# Patient Record
Sex: Female | Born: 1990 | Race: Black or African American | Hispanic: No | Smoking: Current some day smoker
Health system: Southern US, Community
[De-identification: ages and names within clinical notes are randomized; demographics above are authoritative.]

## PROBLEM LIST (undated history)

## (undated) DIAGNOSIS — F419 Anxiety disorder, unspecified: Secondary | ICD-10-CM

## (undated) DIAGNOSIS — T7840XA Allergy, unspecified, initial encounter: Secondary | ICD-10-CM

## (undated) DIAGNOSIS — J020 Streptococcal pharyngitis: Secondary | ICD-10-CM

## (undated) DIAGNOSIS — F329 Major depressive disorder, single episode, unspecified: Secondary | ICD-10-CM

## (undated) DIAGNOSIS — M797 Fibromyalgia: Secondary | ICD-10-CM

## (undated) DIAGNOSIS — F4310 Post-traumatic stress disorder, unspecified: Secondary | ICD-10-CM

## (undated) DIAGNOSIS — F32A Depression, unspecified: Secondary | ICD-10-CM

## (undated) HISTORY — DX: Allergy, unspecified, initial encounter: T78.40XA

## (undated) HISTORY — DX: Post-traumatic stress disorder, unspecified: F43.10

## (undated) HISTORY — DX: Depression, unspecified: F32.A

## (undated) HISTORY — DX: Major depressive disorder, single episode, unspecified: F32.9

## (undated) HISTORY — PX: WISDOM TOOTH EXTRACTION: SHX21

---

## 2014-03-08 ENCOUNTER — Ambulatory Visit (INDEPENDENT_AMBULATORY_CARE_PROVIDER_SITE_OTHER): Payer: No Typology Code available for payment source | Admitting: Emergency Medicine

## 2014-03-08 VITALS — BP 110/64 | HR 85 | Temp 98.5°F | Resp 16 | Ht 61.0 in | Wt 155.0 lb

## 2014-03-08 DIAGNOSIS — Z01818 Encounter for other preprocedural examination: Secondary | ICD-10-CM

## 2014-03-08 LAB — CBC WITH DIFFERENTIAL/PLATELET
BASOS ABS: 0 10*3/uL (ref 0.0–0.1)
Basophils Relative: 0 % (ref 0–1)
EOS PCT: 1 % (ref 0–5)
Eosinophils Absolute: 0.1 10*3/uL (ref 0.0–0.7)
HCT: 40.2 % (ref 36.0–46.0)
Hemoglobin: 13.3 g/dL (ref 12.0–15.0)
Lymphocytes Relative: 30 % (ref 12–46)
Lymphs Abs: 2.3 10*3/uL (ref 0.7–4.0)
MCH: 28.2 pg (ref 26.0–34.0)
MCHC: 33.1 g/dL (ref 30.0–36.0)
MCV: 85.4 fL (ref 78.0–100.0)
Monocytes Absolute: 0.6 10*3/uL (ref 0.1–1.0)
Monocytes Relative: 8 % (ref 3–12)
Neutro Abs: 4.8 10*3/uL (ref 1.7–7.7)
Neutrophils Relative %: 61 % (ref 43–77)
PLATELETS: 204 10*3/uL (ref 150–400)
RBC: 4.71 MIL/uL (ref 3.87–5.11)
RDW: 14.7 % (ref 11.5–15.5)
WBC: 7.8 10*3/uL (ref 4.0–10.5)

## 2014-03-08 NOTE — Progress Notes (Signed)
[facility] [address], [place] [address] [phone]  Date:  03/08/2014   Name:  Robin Richmond   DOB:  Jul 23, 1991   MRN:  [id number]  PCP:  No PCP Per Patient    Chief Complaint: Annual Exam   History of Present Illness:  Robin Richmond is a 23 y.o. very pleasant female patient who presents with the following:  Preoperative clearance for plastic surgery.  No medications,  No current medical problems.  Stopped smoking 10 days ago.  Denies other complaint or health concern today.   There are no active problems to display for this patient.   Past Medical History  Diagnosis Date  . Allergy     History reviewed. No pertinent past surgical history.  History  Substance Use Topics  . Smoking status: Former Games developermoker  . Smokeless tobacco: Not on file  . Alcohol Use: Not on file    Family History  Problem Relation Age of Onset  . Diabetes Father   . Stroke Father     Allergies  Allergen Reactions  . Penicillins Rash    Medication list has been reviewed and updated.  No current outpatient prescriptions on file prior to visit.   No current facility-administered medications on file prior to visit.    Review of Systems:  As per HPI, otherwise negative.    Physical Examination: Filed Vitals:   03/08/14 1702  BP: 110/64  Pulse: 85  Temp: 98.5 F (36.9 C)  Resp: 16   Filed Vitals:   03/08/14 1702  Height: 5\' 1"  (1.549 m)  Weight: 155 lb (70.308 kg)   Body mass index is 29.3 kg/(m^2). Ideal Body Weight: Weight in (lb) to have BMI = 25: 132  GEN: WDWN, NAD, Non-toxic, A & O x 3 HEENT: Atraumatic, Normocephalic. Neck supple. No masses, No LAD. Ears and Nose: No external deformity. CV: RRR, No M/G/R. No JVD. No thrill. No extra heart sounds. PULM: CTA B, no wheezes, crackles, rhonchi. No retractions. No resp. distress. No accessory muscle use. ABD: S, NT, ND, +BS. No rebound. No HSM. EXTR: No c/c/e NEURO Normal gait.  PSYCH:  Normally interactive. Conversant. Not depressed or anxious appearing.  Calm demeanor.    Assessment and Plan: Preoperative assessment CBC pending   Signed,  Phillips OdorJeffery Siria Calandro, MD

## 2014-03-08 NOTE — Addendum Note (Signed)
Addended by: Thelma BargeICHARDSON, Finlee Concepcion D on: 03/08/2014 05:27 PM   Modules accepted: Orders

## 2014-03-11 ENCOUNTER — Telehealth: Payer: Self-pay | Admitting: Radiology

## 2014-03-11 ENCOUNTER — Telehealth: Payer: Self-pay

## 2014-03-11 NOTE — Telephone Encounter (Signed)
Please review CBC...the patient needing for Pre-Op.

## 2014-03-11 NOTE — Telephone Encounter (Signed)
The patient dropped off paperwork to be filled out with results of CBC; need to be faxed to number on form....leaving form in 03/11/14 date in notebook at [employer] Plumber[employer]bara Briggs' desk.  Please call the patient once form is faxed.  The patient's phone # is [phone].

## 2014-03-12 NOTE — Telephone Encounter (Signed)
Form completed and faxed to Dr Greig CastillaAndrew Jimerson's office w/confirmation. Notified pt and left orig for her to p/up. Scanned copy.

## 2014-03-12 NOTE — Telephone Encounter (Signed)
PT CALLED AGAIN THIS MORNING STRESSING THE IMPORTANCE OF THE FORM THAT NEEDED SIGNING BY THE DR AND FAXED. THE FAX NUMBER SHE SAID IS ON THE FORM SO SHE DIDN'T REMEMBER WHAT IT IS STATED SHE NEED THIS BY 1:30 TODAY. PLEASE CALL [phone] WHEN DONE

## 2014-03-12 NOTE — Telephone Encounter (Signed)
Copy of CBC and OV notes printed and put in Dr Ewell PoeAnderson's box w/form for completion.

## 2014-03-12 NOTE — Telephone Encounter (Signed)
Dr. Dareen PianoAnderson please review the CBC and let the lab know. Do you have this paperwork to fill out? If you need me to I can fill out and fax for pt. Let me know.

## 2014-05-15 ENCOUNTER — Encounter (HOSPITAL_COMMUNITY): Payer: Self-pay | Admitting: Emergency Medicine

## 2014-05-15 DIAGNOSIS — R1084 Generalized abdominal pain: Secondary | ICD-10-CM | POA: Insufficient documentation

## 2014-05-15 DIAGNOSIS — R112 Nausea with vomiting, unspecified: Secondary | ICD-10-CM | POA: Insufficient documentation

## 2014-05-15 DIAGNOSIS — R197 Diarrhea, unspecified: Secondary | ICD-10-CM | POA: Diagnosis not present

## 2014-05-15 DIAGNOSIS — J020 Streptococcal pharyngitis: Secondary | ICD-10-CM | POA: Insufficient documentation

## 2014-05-15 DIAGNOSIS — Z880 Allergy status to penicillin: Secondary | ICD-10-CM | POA: Diagnosis not present

## 2014-05-15 DIAGNOSIS — Z3202 Encounter for pregnancy test, result negative: Secondary | ICD-10-CM | POA: Diagnosis not present

## 2014-05-15 DIAGNOSIS — Z87891 Personal history of nicotine dependence: Secondary | ICD-10-CM | POA: Diagnosis not present

## 2014-05-15 NOTE — ED Notes (Signed)
Pt. reports nausea , vomitting and diarrhea onset this morning , currently taking Erythromycin for throat infection ( strep) diagnosed at school [facility] . No fever or chills.

## 2014-05-16 ENCOUNTER — Emergency Department (HOSPITAL_COMMUNITY)
Admission: EM | Admit: 2014-05-16 | Discharge: 2014-05-16 | Disposition: A | Discharge status: Home / Self Care | Payer: No Typology Code available for payment source | Attending: Emergency Medicine | Admitting: Emergency Medicine

## 2014-05-16 DIAGNOSIS — J020 Streptococcal pharyngitis: Secondary | ICD-10-CM

## 2014-05-16 DIAGNOSIS — R112 Nausea with vomiting, unspecified: Secondary | ICD-10-CM

## 2014-05-16 DIAGNOSIS — R197 Diarrhea, unspecified: Secondary | ICD-10-CM

## 2014-05-16 DIAGNOSIS — R1084 Generalized abdominal pain: Secondary | ICD-10-CM

## 2014-05-16 HISTORY — DX: Streptococcal pharyngitis: J02.0

## 2014-05-16 LAB — COMPREHENSIVE METABOLIC PANEL
ALT: 30 U/L (ref 0–35)
AST: 23 U/L (ref 0–37)
Albumin: 3.9 g/dL (ref 3.5–5.2)
Alkaline Phosphatase: 84 U/L (ref 39–117)
Anion gap: 17 — ABNORMAL HIGH (ref 5–15)
BUN: 12 mg/dL (ref 6–23)
CALCIUM: 9.8 mg/dL (ref 8.4–10.5)
CO2: 22 mEq/L (ref 19–32)
Chloride: 99 mEq/L (ref 96–112)
Creatinine, Ser: 0.54 mg/dL (ref 0.50–1.10)
GLUCOSE: 118 mg/dL — AB (ref 70–99)
Potassium: 3.8 mEq/L (ref 3.7–5.3)
SODIUM: 138 meq/L (ref 137–147)
Total Bilirubin: 0.3 mg/dL (ref 0.3–1.2)
Total Protein: 9.2 g/dL — ABNORMAL HIGH (ref 6.0–8.3)

## 2014-05-16 LAB — CBC WITH DIFFERENTIAL/PLATELET
Basophils Absolute: 0 10*3/uL (ref 0.0–0.1)
Basophils Relative: 0 % (ref 0–1)
EOS ABS: 0 10*3/uL (ref 0.0–0.7)
EOS PCT: 0 % (ref 0–5)
HEMATOCRIT: 42.5 % (ref 36.0–46.0)
Hemoglobin: 13.7 g/dL (ref 12.0–15.0)
LYMPHS ABS: 0.9 10*3/uL (ref 0.7–4.0)
Lymphocytes Relative: 5 % — ABNORMAL LOW (ref 12–46)
MCH: 27.5 pg (ref 26.0–34.0)
MCHC: 32.2 g/dL (ref 30.0–36.0)
MCV: 85.2 fL (ref 78.0–100.0)
MONO ABS: 0.7 10*3/uL (ref 0.1–1.0)
Monocytes Relative: 4 % (ref 3–12)
Neutro Abs: 15 10*3/uL — ABNORMAL HIGH (ref 1.7–7.7)
Neutrophils Relative %: 91 % — ABNORMAL HIGH (ref 43–77)
PLATELETS: 237 10*3/uL (ref 150–400)
RBC: 4.99 MIL/uL (ref 3.87–5.11)
RDW: 13.9 % (ref 11.5–15.5)
WBC: 16.7 10*3/uL — ABNORMAL HIGH (ref 4.0–10.5)

## 2014-05-16 LAB — PREGNANCY, URINE: Preg Test, Ur: NEGATIVE

## 2014-05-16 LAB — URINALYSIS, ROUTINE W REFLEX MICROSCOPIC
Bilirubin Urine: NEGATIVE
Glucose, UA: NEGATIVE mg/dL
Ketones, ur: 40 mg/dL — AB
Nitrite: NEGATIVE
PROTEIN: 100 mg/dL — AB
Specific Gravity, Urine: 1.035 — ABNORMAL HIGH (ref 1.005–1.030)
UROBILINOGEN UA: 0.2 mg/dL (ref 0.0–1.0)
pH: 6 (ref 5.0–8.0)

## 2014-05-16 LAB — URINE MICROSCOPIC-ADD ON

## 2014-05-16 MED ORDER — CEFTRIAXONE SODIUM 1 G IJ SOLR
1.0000 g | Freq: Once | INTRAMUSCULAR | Status: AC
  Administered 2014-05-16: 1 g via INTRAVENOUS
  Filled 2014-05-16: qty 10

## 2014-05-16 MED ORDER — ONDANSETRON HCL 4 MG/2ML IJ SOLN
4.0000 mg | Freq: Once | INTRAMUSCULAR | Status: AC
  Administered 2014-05-16: 4 mg via INTRAVENOUS
  Filled 2014-05-16: qty 2

## 2014-05-16 MED ORDER — ONDANSETRON 4 MG PO TBDP: ORAL_TABLET | ORAL | Status: DC

## 2014-05-16 MED ORDER — METOCLOPRAMIDE HCL 5 MG/ML IJ SOLN
10.0000 mg | Freq: Once | INTRAMUSCULAR | Status: AC
  Administered 2014-05-16: 10 mg via INTRAVENOUS
  Filled 2014-05-16: qty 2

## 2014-05-16 MED ORDER — SODIUM CHLORIDE 0.9 % IV BOLUS (SEPSIS)
1000.0000 mL | Freq: Once | INTRAVENOUS | Status: AC
  Administered 2014-05-16: 1000 mL via INTRAVENOUS

## 2014-05-16 MED ORDER — ONDANSETRON HCL 4 MG/2ML IJ SOLN
4.0000 mg | Freq: Once | INTRAMUSCULAR | Status: AC
Start: 2014-05-16 — End: 2014-05-16
  Administered 2014-05-16: 4 mg via INTRAVENOUS

## 2014-05-16 MED ORDER — IBUPROFEN 600 MG PO TABS: 600.0000 mg | ORAL_TABLET | Freq: Four times a day (QID) | ORAL | Status: DC | PRN

## 2014-05-16 MED ORDER — KETOROLAC TROMETHAMINE 15 MG/ML IJ SOLN
15.0000 mg | Freq: Once | INTRAMUSCULAR | Status: AC
  Administered 2014-05-16: 15 mg via INTRAVENOUS
  Filled 2014-05-16: qty 1

## 2014-05-16 MED ORDER — FENTANYL CITRATE 0.05 MG/ML IJ SOLN
75.0000 ug | Freq: Once | INTRAMUSCULAR | Status: AC
  Administered 2014-05-16: 75 ug via INTRAVENOUS
  Filled 2014-05-16: qty 2

## 2014-05-16 MED ORDER — CEPHALEXIN 500 MG PO CAPS: 500.0000 mg | ORAL_CAPSULE | Freq: Three times a day (TID) | ORAL | Status: DC

## 2014-05-16 NOTE — ED Provider Notes (Signed)
CSN: [id number]     Arrival date & time 05/15/14  2311 History   First MD Initiated Contact with Patient 05/16/14 0203     Chief Complaint  Patient presents with  . Emesis  . Diarrhea     (Consider location/radiation/quality/duration/timing/severity/associated sxs/prior Treatment) HPI Comments: 23 year old female past smoker but no significant echo history presents with vomiting, nausea and diarrhea since this morning, recurrent. Patient feels is worsened after taking erythromycin yesterday for strep infection. Patient is diagnosed at the school clinic. Patient denies fevers or chills. No abnormal surgery history. Patient has diffuse generalized cramping abdominal ache.  Patient is a 23 y.o. female presenting with vomiting and diarrhea. The history is provided by the patient.  Emesis Associated symptoms: abdominal pain, diarrhea and sore throat   Associated symptoms: no chills and no headaches   Diarrhea Associated symptoms: abdominal pain and vomiting   Associated symptoms: no chills, no fever and no headaches     Past Medical History  Diagnosis Date  . Allergy   . Strep throat    History reviewed. No pertinent past surgical history. Family History  Problem Relation Age of Onset  . Diabetes Father   . Stroke Father    History  Substance Use Topics  . Smoking status: Former Games developer  . Smokeless tobacco: Not on file  . Alcohol Use: Yes   OB History   Grav Para Term Preterm Abortions TAB SAB Ect Mult Living                 Review of Systems  Constitutional: Positive for appetite change. Negative for fever and chills.  HENT: Positive for sore throat. Negative for congestion.   Eyes: Negative for visual disturbance.  Respiratory: Negative for shortness of breath.   Cardiovascular: Negative for chest pain.  Gastrointestinal: Positive for vomiting, abdominal pain and diarrhea.  Genitourinary: Negative for dysuria and flank pain.  Musculoskeletal: Negative for back pain,  neck pain and neck stiffness.  Skin: Negative for rash.  Neurological: Negative for light-headedness and headaches.      Allergies  Penicillins  Home Medications   Prior to Admission medications   Medication Sig Start Date End Date Taking? Authorizing Provider  etonogestrel (IMPLANON) 68 MG IMPL implant Inject 1 each into the skin once.    Historical Provider, MD  ibuprofen (ADVIL,MOTRIN) 600 MG tablet Take 1 tablet (600 mg total) by mouth every 6 (six) hours as needed. 05/16/14   Enid Skeens, MD  ondansetron (ZOFRAN ODT) 4 MG disintegrating tablet  ODT q4 hours prn nausea/vomit 05/16/14   Enid Skeens, MD   BP 121/73  Pulse 62  Temp(Src) 98.1 F (36.7 C) (Oral)  Resp 24  Ht 5' (1.524 m)  Wt 150 lb (68.04 kg)  BMI 29.30 kg/m2  SpO2 100% Physical Exam  Nursing note and vitals reviewed. Constitutional: She is oriented to person, place, and time. She appears well-developed and well-nourished.  HENT:  Head: Normocephalic.  Posterior erythema and mild exudate bilateral tonsils, no sign of peritonsillar abscess at this time.No trismus, uvular deviation, unilateral posterior pharyngeal edema or submandibular swelling.   Eyes: Conjunctivae are normal. Right eye exhibits no discharge. Left eye exhibits no discharge.  Neck: Normal range of motion. Neck supple. No tracheal deviation present.  Cardiovascular: Normal rate and regular rhythm.   Pulmonary/Chest: Effort normal and breath sounds normal.  Abdominal: Soft. She exhibits no distension. There is tenderness (Diffuse worse central). There is no rebound and no guarding.  Musculoskeletal: She  exhibits no edema.  Neurological: She is alert and oriented to person, place, and time.  Skin: Skin is warm. No rash noted.  Psychiatric: She has a normal mood and affect.    ED Course  Procedures (including critical care time) Labs Review Labs Reviewed  URINALYSIS, ROUTINE W REFLEX MICROSCOPIC - Abnormal; Notable for the  following:    Color, Urine AMBER (*)    APPearance CLOUDY (*)    Specific Gravity, Urine 1.035 (*)    Hgb urine dipstick MODERATE (*)    Ketones, ur 40 (*)    Protein, ur 100 (*)    Leukocytes, UA SMALL (*)    All other components within normal limits  CBC WITH DIFFERENTIAL - Abnormal; Notable for the following:    WBC 16.7 (*)    Neutrophils Relative % 91 (*)    Neutro Abs 15.0 (*)    Lymphocytes Relative 5 (*)    All other components within normal limits  COMPREHENSIVE METABOLIC PANEL - Abnormal; Notable for the following:    Glucose, Bld 118 (*)    Total Protein 9.2 (*)    Anion gap 17 (*)    All other components within normal limits  URINE MICROSCOPIC-ADD ON - Abnormal; Notable for the following:    Squamous Epithelial / LPF MANY (*)    Bacteria, UA MANY (*)    All other components within normal limits  PREGNANCY, URINE    Imaging Review No results found.   EKG Interpretation None      MDM   Final diagnoses:  Strep pharyngitis  Nausea vomiting and diarrhea  Abdominal pain, generalized   Patient presents with clinically gastroenteritis versus side effect from erythromycin medicine in addition to known strep throat diagnosis. Patient has white blood cell count elevation on lab work which could be from strep throat versus abdominal infection. Discussed differential diagnosis with the patient plan for pain meds, IV fluids and reassessment. Reassessment patient did not have significant improvement in central abdominal pain persisted. Discussed CT abdomen to rule out atypical appendicitis. Initially patient agreed however on second reassessment patient felt mild improvement and did not want a CT scan. I discussed the risks and benefits and plan of care including if patient refuses CT scan she needs reassessment in 24 hours to ensure improvement in symptoms. Patient has mild diffuse tenderness on examination. Patient's capacity make decisions and decided to not get CT scan and  will come back for reassessment. Patient's friends in the room during this discussion. Patient realizes this may be appendicitis which will worsen if not treated or diagnosed in the near future.  Rocephin given IV for strep throat. Plan for Keflex outpatient. Results and differential diagnosis were discussed with the patient/parent/guardian. Close follow up outpatient was discussed, comfortable with the plan.   Medications  sodium chloride 0.9 % bolus 1,000 mL (0 mLs Intravenous Stopped 05/16/14 0429)  ondansetron (ZOFRAN) injection 4 mg (4 mg Intravenous Given 05/16/14 0244)  fentaNYL (SUBLIMAZE) injection 75 mcg (75 mcg Intravenous Given 05/16/14 0302)  cefTRIAXone (ROCEPHIN) 1 g in dextrose 5 % 50 mL IVPB (0 g Intravenous Stopped 05/16/14 0350)  ondansetron (ZOFRAN) injection 4 mg (4 mg Intravenous Given 05/16/14 0401)  ketorolac (TORADOL) 15 MG/ML injection 15 mg (15 mg Intravenous Given 05/16/14 0430)  metoCLOPramide (REGLAN) injection 10 mg (10 mg Intravenous Given 05/16/14 0444)  sodium chloride 0.9 % bolus 1,000 mL (1,000 mLs Intravenous New Bag/Given 05/16/14 0445)    Filed Vitals:   05/16/14 9604 05/16/14  0400 05/16/14 0415 05/16/14 0424  BP: 106/57 104/55 122/89 121/73  Pulse: 71 77 77 62  Temp:      TempSrc:      Resp:    24  Height:      Weight:      SpO2: 100% 100% 100% 100%    Final diagnoses:  Strep pharyngitis  Nausea vomiting and diarrhea  Abdominal pain, generalized        Enid Skeens, MD 05/16/14 [phone]

## 2014-05-16 NOTE — ED Notes (Signed)
Pt reports n/v/d that began approx 10:00 this a.m. - pt recently diagnosed w/ strep throat and given rx for erythromycin - pt states she only took one dose and began having n/v/d - pt admits to fever yesterday however unable to recall how high her temp was. Pt also c/o abd tenderness and pain as well.

## 2014-05-16 NOTE — ED Notes (Signed)
Pt refused fluids. Pt still reports she feels nauseated.

## 2014-05-16 NOTE — ED Notes (Signed)
Pt reports nausea, EDP informed.

## 2014-05-16 NOTE — ED Notes (Signed)
Pt states she does not want a CT scan, EDP informed.

## 2014-05-16 NOTE — ED Notes (Signed)
EDP informed of pt's nausea. CT called and informed pt only have IV contrast for CT scan.

## 2014-05-16 NOTE — Discharge Instructions (Signed)
If your abdominal pain worsens, you develop fevers, persistent vomiting or if your pain moves to the right lower quadrant return immediately to see your physician or come to the Emergency Department.  Thank you Take Zofran as needed for nausea. As discussed while you in the ER have a recheck in the next 24 hours to make sure your improving. If you change your mind and would like to CT scan to look for appendicitis return to the ER.  If you were given medicines take as directed.  If you are on coumadin or contraceptives realize their levels and effectiveness is altered by many different medicines.  If you have any reaction (rash, tongues swelling, other) to the medicines stop taking and see a physician.   Please follow up as directed and return to the ER or see a physician for new or worsening symptoms.  Thank you. Filed Vitals:   05/16/14 0351 05/16/14 0400 05/16/14 0415 05/16/14 0424  BP: 106/57 104/55 122/89 121/73  Pulse: 71 77 77 62  Temp:      TempSrc:      Resp:    24  Height:      Weight:      SpO2: 100% 100% 100% 100%

## 2014-07-10 ENCOUNTER — Encounter (HOSPITAL_COMMUNITY): Payer: Self-pay

## 2014-07-10 ENCOUNTER — Emergency Department (HOSPITAL_COMMUNITY)
Admission: EM | Admit: 2014-07-10 | Discharge: 2014-07-10 | Disposition: A | Discharge status: Home / Self Care | Payer: No Typology Code available for payment source | Source: Home / Self Care | Attending: Family Medicine | Admitting: Family Medicine

## 2014-07-10 DIAGNOSIS — H6690 Otitis media, unspecified, unspecified ear: Secondary | ICD-10-CM

## 2014-07-10 MED ORDER — ANTIPYRINE-BENZOCAINE 5.4-1.4 % OT SOLN: 3.0000 [drp] | OTIC | Status: DC | PRN

## 2014-07-10 MED ORDER — CEFDINIR 300 MG PO CAPS
300.0000 mg | ORAL_CAPSULE | Freq: Two times a day (BID) | ORAL | Status: DC
Start: 2014-07-10 — End: 2015-10-06

## 2014-07-10 MED ORDER — CHLORPHENIRAMINE-PSE-IBUPROFEN 2-30-200 MG PO TABS: ORAL_TABLET | ORAL | Status: DC

## 2014-07-10 NOTE — ED Provider Notes (Signed)
CSN: [id number]     Arrival date & time 07/10/14  1032 History   First MD Initiated Contact with Patient 07/10/14 1119     Chief Complaint  Patient presents with  . URI   (Consider location/radiation/quality/duration/timing/severity/associated sxs/prior Treatment) HPI          23 year old female presents complaining of bilateral ear pain. This initially started about 10 days ago with cough and congestion. The ear pain initially started 3 days ago in the right ear and has been progressively worsening. She now feels like her ear is congested and she cannot hear out of her right ear. She just started to feel pain in her left ear this morning. No fever, chills, NVD, or sinus pain/pressure. Over-the-counter medications are not helping significantly with the ear pain  Past Medical History  Diagnosis Date  . Allergy   . Strep throat    History reviewed. No pertinent past surgical history. Family History  Problem Relation Age of Onset  . Diabetes Father   . Stroke Father    History  Substance Use Topics  . Smoking status: Former Games developermoker  . Smokeless tobacco: Not on file  . Alcohol Use: Yes   OB History    No data available     Review of Systems  Constitutional: Negative for fever, chills and fatigue.  HENT: Positive for congestion and ear pain. Negative for ear discharge and sore throat.   Respiratory: Positive for cough.   Cardiovascular: Negative for chest pain.  Gastrointestinal: Negative for abdominal pain.  All other systems reviewed and are negative.   Allergies  Clindamycin/lincomycin and Penicillins  Home Medications   Prior to Admission medications   Medication Sig Start Date End Date Taking? Authorizing Provider  antipyrine-benzocaine Lyla Son(AURALGAN) otic solution Place 3-4 drops into both ears as needed for ear pain. 07/10/14   Graylon GoodZachary H Alphonsine Minium, PA-C  cefdinir (OMNICEF) 300 MG capsule Take 1 capsule (300 mg total) by mouth 2 (two) times daily. 07/10/14   Adrian BlackwaterZachary H Suhana Wilner,  PA-C  cephALEXin (KEFLEX) 500 MG capsule Take 1 capsule (500 mg total) by mouth 3 (three) times daily. 05/16/14   Enid SkeensJoshua M Zavitz, MD  Chlorpheniramine-PSE-Ibuprofen (ADVIL ALLERGY SINUS) 2-30-200 MG TABS 1-2 tabs PO Q4-6 hrs PRN 07/10/14   Graylon GoodZachary H Jonise Weightman, PA-C  etonogestrel (IMPLANON) 68 MG IMPL implant Inject 1 each into the skin once.    Historical Provider, MD  ibuprofen (ADVIL,MOTRIN) 600 MG tablet Take 1 tablet (600 mg total) by mouth every 6 (six) hours as needed. 05/16/14   Enid SkeensJoshua M Zavitz, MD  ondansetron (ZOFRAN ODT) 4 MG disintegrating tablet 4mg  ODT q4 hours prn nausea/vomit 05/16/14   Enid SkeensJoshua M Zavitz, MD   BP 117/69 mmHg  Pulse 63  Temp(Src) 97.9 F (36.6 C) (Oral)  Resp 12  SpO2 98% Physical Exam  Constitutional: She is oriented to person, place, and time. Vital signs are normal. She appears well-developed and well-nourished. No distress.  HENT:  Head: Normocephalic and atraumatic.  Right Ear: External ear normal. Tympanic membrane is erythematous.  Left Ear: External ear normal. Tympanic membrane is erythematous.  Nose: Nose normal. Right sinus exhibits no maxillary sinus tenderness and no frontal sinus tenderness. Left sinus exhibits no maxillary sinus tenderness and no frontal sinus tenderness.  Mouth/Throat: Oropharynx is clear and moist. No oropharyngeal exudate.  Bilateral tympanic membranes are erythematous, bulging, injected. Right is worse than left  Eyes: Conjunctivae are normal. Right eye exhibits no discharge. Left eye exhibits no discharge.  Neck: Normal  range of motion.  Cardiovascular: Normal rate, regular rhythm and normal heart sounds.   Pulmonary/Chest: Effort normal and breath sounds normal. No respiratory distress.  Lymphadenopathy:    She has no cervical adenopathy.  Neurological: She is alert and oriented to person, place, and time. She has normal strength. Coordination normal.  Skin: Skin is warm and dry. No rash noted. She is not diaphoretic.   Psychiatric: She has a normal mood and affect. Judgment normal.  Nursing note and vitals reviewed.   ED Course  Procedures (including critical care time) Labs Review Labs Reviewed - No data to display  Imaging Review No results found.   MDM   1. Acute otitis media, recurrence not specified, unspecified laterality, unspecified otitis media type    Treat with cefdinir, Advil allergy sinus, Auralgan drops, and Flonase. Follow-up if worsening    Meds ordered this encounter  Medications  . cefdinir (OMNICEF) 300 MG capsule    Sig: Take 1 capsule (300 mg total) by mouth 2 (two) times daily.    Dispense:  14 capsule    Refill:  0    Order Specific Question:  Supervising Provider    Answer:  Clementeen GrahamOREY, EVAN, S [id number]]  . antipyrine-benzocaine (AURALGAN) otic solution    Sig: Place 3-4 drops into both ears as needed for ear pain.    Dispense:  10 mL    Refill:  1    Order Specific Question:  Supervising Provider    Answer:  Clementeen GrahamOREY, EVAN, S [id number]]  . Chlorpheniramine-PSE-Ibuprofen (ADVIL ALLERGY SINUS) 2-30-200 MG TABS    Sig: 1-2 tabs PO Q4-6 hrs PRN    Dispense:  30 each    Refill:  1    Order Specific Question:  Supervising Provider    Answer:  Clementeen GrahamOREY, EVAN, Kathie RhodesS [[id number]]       Graylon GoodZachary H Cleta Heatley, PA-C 07/10/14 1144

## 2014-07-10 NOTE — ED Notes (Signed)
C/o URI type syx x 1 week

## 2014-07-10 NOTE — Discharge Instructions (Signed)

## 2014-09-22 ENCOUNTER — Emergency Department (HOSPITAL_COMMUNITY)
Admission: EM | Admit: 2014-09-22 | Discharge: 2014-09-22 | Disposition: A | Discharge status: Home / Self Care | Payer: No Typology Code available for payment source | Attending: Emergency Medicine | Admitting: Emergency Medicine

## 2014-09-22 ENCOUNTER — Encounter (HOSPITAL_COMMUNITY): Payer: Self-pay | Admitting: Cardiology

## 2014-09-22 ENCOUNTER — Emergency Department (HOSPITAL_COMMUNITY): Payer: No Typology Code available for payment source

## 2014-09-22 DIAGNOSIS — Z8709 Personal history of other diseases of the respiratory system: Secondary | ICD-10-CM | POA: Insufficient documentation

## 2014-09-22 DIAGNOSIS — Z3202 Encounter for pregnancy test, result negative: Secondary | ICD-10-CM | POA: Insufficient documentation

## 2014-09-22 DIAGNOSIS — Z880 Allergy status to penicillin: Secondary | ICD-10-CM | POA: Diagnosis not present

## 2014-09-22 DIAGNOSIS — Z79899 Other long term (current) drug therapy: Secondary | ICD-10-CM | POA: Insufficient documentation

## 2014-09-22 DIAGNOSIS — R102 Pelvic and perineal pain: Secondary | ICD-10-CM | POA: Insufficient documentation

## 2014-09-22 DIAGNOSIS — T7421XA Adult sexual abuse, confirmed, initial encounter: Secondary | ICD-10-CM | POA: Insufficient documentation

## 2014-09-22 DIAGNOSIS — R0781 Pleurodynia: Secondary | ICD-10-CM | POA: Diagnosis present

## 2014-09-22 DIAGNOSIS — Z87891 Personal history of nicotine dependence: Secondary | ICD-10-CM | POA: Diagnosis not present

## 2014-09-22 DIAGNOSIS — Z792 Long term (current) use of antibiotics: Secondary | ICD-10-CM | POA: Insufficient documentation

## 2014-09-22 LAB — POC URINE PREG, ED: Preg Test, Ur: NEGATIVE

## 2014-09-22 MED ORDER — PROMETHAZINE HCL 25 MG PO TABS
ORAL_TABLET | ORAL | Status: AC
  Administered 2014-09-22: 75 mg
  Filled 2014-09-22: qty 3

## 2014-09-22 MED ORDER — METRONIDAZOLE 500 MG PO TABS
ORAL_TABLET | ORAL | Status: AC
  Administered 2014-09-22: 2000 mg
  Filled 2014-09-22: qty 4

## 2014-09-22 MED ORDER — AZITHROMYCIN 1 G PO PACK
PACK | ORAL | Status: AC
  Administered 2014-09-22: 1 g
  Filled 2014-09-22: qty 1

## 2014-09-22 MED ORDER — IBUPROFEN 400 MG PO TABS
600.0000 mg | ORAL_TABLET | Freq: Once | ORAL | Status: AC
  Administered 2014-09-22: 600 mg via ORAL
  Filled 2014-09-22 (×2): qty 1

## 2014-09-22 MED ORDER — CEFIXIME 400 MG PO TABS
ORAL_TABLET | ORAL | Status: AC
  Administered 2014-09-22: 400 mg
  Filled 2014-09-22: qty 1

## 2014-09-22 MED ORDER — ULIPRISTAL ACETATE 30 MG PO TABS
ORAL_TABLET | ORAL | Status: DC
Start: 2014-09-22 — End: 2014-09-22
  Filled 2014-09-22: qty 1
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
## 2014-09-22 NOTE — ED Notes (Signed)
Please call officer-  C.A GOSMAN for pt transport  Back to residence-per officer  
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
## 2014-09-22 NOTE — ED Notes (Addendum)
SANE RN, Laurell Josephsheryl Anderson paged.

## 2014-09-22 NOTE — ED Notes (Signed)
SANE RN at bedside.

## 2014-09-22 NOTE — ED Notes (Signed)
Please call officer-  Devra Dopp.A GOSMAN for pt transport  Back to residence-per officer

## 2014-10-29 ENCOUNTER — Telehealth (HOSPITAL_BASED_OUTPATIENT_CLINIC_OR_DEPARTMENT_OTHER): Payer: Self-pay | Admitting: Emergency Medicine

## 2014-12-18 ENCOUNTER — Telehealth (HOSPITAL_COMMUNITY): Payer: Self-pay | Admitting: *Deleted

## 2014-12-18 NOTE — Telephone Encounter (Signed)
pt called to get location of office toda 12-18-14. Informed pt that her appt is for 01-17-15. pt became upset and wanted to be seen today. Appologized to pt and stated that provider do not have any openings. Pt later stated that she is in [place] and she is not from the area. Apologized again but pt later hung up upset. Pt later called back requesting to speak with Dr. Tenny Crawoss. Spoke with Dr. Tenny Crawoss and per Dr. Tenny Crawoss due to pt being a new pt and not having any available slot to see her we are unable to see her today. Came back on the phone to informed pt of what the provider stated and pt was still upset and stated that she would like to cancel her appt and if office could ref. Her to another office. Informed pt that unfortunately due to her not officially being a pt here yet office can not ref her to another doctor's office but is willing to give her several numbers to call and see if they could see her soon. Pt later asked if I know of [facility] in [place] and it they will take her insurance. Informed pt that I am not sure but I could provide her the number to call them. Number was given and pt confirmed that she would like to cancel her appt.

## 2015-01-17 ENCOUNTER — Ambulatory Visit (HOSPITAL_COMMUNITY): Payer: Self-pay | Admitting: Psychiatry

## 2015-05-05 ENCOUNTER — Emergency Department (HOSPITAL_COMMUNITY)
Admission: EM | Admit: 2015-05-05 | Discharge: 2015-05-05 | Disposition: A | Discharge status: Home / Self Care | Payer: Managed Care, Other (non HMO) | Source: Home / Self Care | Attending: Family Medicine | Admitting: Family Medicine

## 2015-05-05 ENCOUNTER — Encounter (HOSPITAL_COMMUNITY): Payer: Self-pay | Admitting: *Deleted

## 2015-05-05 DIAGNOSIS — J069 Acute upper respiratory infection, unspecified: Secondary | ICD-10-CM | POA: Diagnosis not present

## 2015-05-05 MED ORDER — DOXYCYCLINE HYCLATE 100 MG PO CAPS: 100.0000 mg | ORAL_CAPSULE | Freq: Two times a day (BID) | ORAL | Status: DC

## 2015-05-05 MED ORDER — FLUTICASONE PROPIONATE 50 MCG/ACT NA SUSP: 1.0000 | Freq: Two times a day (BID) | NASAL | Status: DC

## 2015-05-05 NOTE — ED Notes (Signed)
Pt    Reports            Allergies  And   For  The  Most  Part  Non  Productive  Cough      With  Symptoms     X  2  Days   - pt is  Sitting  Upright     On         Exam     Table    -       Speaking  In  Complete    sentances

## 2015-05-05 NOTE — ED Provider Notes (Signed)
CSN: [id number]     Arrival date & time 05/05/15  1516 History   First MD Initiated Contact with Patient 05/05/15 1626     Chief Complaint  Patient presents with  . URI   (Consider location/radiation/quality/duration/timing/severity/associated sxs/prior Treatment) Patient is a 24 y.o. female presenting with URI. The history is provided by the patient.  URI Presenting symptoms: congestion, cough and rhinorrhea   Presenting symptoms: no fever   Severity:  Mild Duration:  2 days Progression:  Worsening Chronicity:  New Relieved by:  None tried Worsened by:  Nothing tried Ineffective treatments:  None tried Associated symptoms: no sneezing and no wheezing   Risk factors comment:  Smoker   Past Medical History  Diagnosis Date  . Allergy   . Strep throat    History reviewed. No pertinent past surgical history. Family History  Problem Relation Age of Onset  . Diabetes Father   . Stroke Father    Social History  Substance Use Topics  . Smoking status: Former Smoker    Types: Pipe, Software engineer  . Smokeless tobacco: None  . Alcohol Use: Yes   OB History    No data available     Review of Systems  Constitutional: Negative for fever and chills.  HENT: Positive for congestion, postnasal drip and rhinorrhea. Negative for sneezing.   Respiratory: Positive for cough. Negative for chest tightness, shortness of breath and wheezing.   Cardiovascular: Negative.   All other systems reviewed and are negative.   Allergies  Clindamycin/lincomycin and Penicillins  Home Medications   Prior to Admission medications   Medication Sig Start Date End Date Taking? Authorizing Provider  antipyrine-benzocaine Lyla Son) otic solution Place 3-4 drops into both ears as needed for ear pain. 07/10/14   Graylon Good, PA-C  cefdinir (OMNICEF) 300 MG capsule Take 1 capsule (300 mg total) by mouth 2 (two) times daily. 07/10/14   Adrian Blackwater Baker, PA-C  cephALEXin (KEFLEX) 500 MG capsule Take 1  capsule (500 mg total) by mouth 3 (three) times daily. 05/16/14   Blane Ohara, MD  Chlorpheniramine-PSE-Ibuprofen (ADVIL ALLERGY SINUS) 2-30-200 MG TABS 1-2 tabs PO Q4-6 hrs PRN 07/10/14   Graylon Good, PA-C  doxycycline (VIBRAMYCIN) 100 MG capsule Take 1 capsule (100 mg total) by mouth 2 (two) times daily. 05/05/15   Linna Hoff, MD  etonogestrel (IMPLANON) 68 MG IMPL implant Inject 1 each into the skin once.    Historical Provider, MD  fluticasone (FLONASE) 50 MCG/ACT nasal spray Place 1 spray into both nostrils 2 (two) times daily. 05/05/15   Linna Hoff, MD  ibuprofen (ADVIL,MOTRIN) 600 MG tablet Take 1 tablet (600 mg total) by mouth every 6 (six) hours as needed. 05/16/14   Blane Ohara, MD  ondansetron (ZOFRAN ODT) 4 MG disintegrating tablet  ODT q4 hours prn nausea/vomit 05/16/14   Blane Ohara, MD   Meds Ordered and Administered this Visit  Medications - No data to display  BP 122/79 mmHg  Pulse 63  Temp(Src) 98.9 F (37.2 C) (Oral)  Resp 16  SpO2 98% No data found.   Physical Exam  Constitutional: She is oriented to person, place, and time. She appears well-developed and well-nourished. No distress.  HENT:  Right Ear: External ear normal.  Left Ear: External ear normal.  Nose: Nose normal.  Mouth/Throat: Oropharynx is clear and moist.  Neck: Normal range of motion. Neck supple.  Cardiovascular: Normal heart sounds and intact distal pulses.   Pulmonary/Chest: Breath sounds normal. She has  no wheezes. She exhibits no tenderness.  Lymphadenopathy:    She has no cervical adenopathy.  Neurological: She is alert and oriented to person, place, and time.  Skin: Skin is warm and dry.  Nursing note and vitals reviewed.   ED Course  Procedures (including critical care time)  Labs Review Labs Reviewed - No data to display  Imaging Review No results found.   Visual Acuity Review  Right Eye Distance:   Left Eye Distance:   Bilateral Distance:    Right Eye  Near:   Left Eye Near:    Bilateral Near:         MDM   1. URI (upper respiratory infection)    rx for doxy and atrovent in smoker with uri.    Linna Hoff, MD 05/05/15 2109

## 2015-07-04 ENCOUNTER — Emergency Department (HOSPITAL_COMMUNITY)
Admission: EM | Admit: 2015-07-04 | Discharge: 2015-07-04 | Disposition: A | Discharge status: Home / Self Care | Payer: Managed Care, Other (non HMO) | Attending: Emergency Medicine | Admitting: Emergency Medicine

## 2015-07-04 ENCOUNTER — Encounter (HOSPITAL_COMMUNITY): Payer: Self-pay | Admitting: Family Medicine

## 2015-07-04 DIAGNOSIS — Z87891 Personal history of nicotine dependence: Secondary | ICD-10-CM | POA: Insufficient documentation

## 2015-07-04 DIAGNOSIS — Z792 Long term (current) use of antibiotics: Secondary | ICD-10-CM | POA: Diagnosis not present

## 2015-07-04 DIAGNOSIS — Z79899 Other long term (current) drug therapy: Secondary | ICD-10-CM | POA: Diagnosis not present

## 2015-07-04 DIAGNOSIS — F419 Anxiety disorder, unspecified: Secondary | ICD-10-CM | POA: Diagnosis present

## 2015-07-04 DIAGNOSIS — Z880 Allergy status to penicillin: Secondary | ICD-10-CM | POA: Diagnosis not present

## 2015-07-04 HISTORY — DX: Anxiety disorder, unspecified: F41.9

## 2015-07-04 MED ORDER — HYDROXYZINE HCL 25 MG PO TABS: 25.0000 mg | ORAL_TABLET | Freq: Four times a day (QID) | ORAL | Status: DC | PRN

## 2015-07-04 NOTE — ED Notes (Signed)
Pt here for new medication for her anxiety. Sts the other is expired and not working. Pt not anxious at triage.
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
## 2015-07-04 NOTE — Discharge Instructions (Signed)
Generalized Anxiety Disorder Generalized anxiety disorder (GAD) is a mental disorder. It interferes with life functions, including relationships, work, and school. GAD is different from normal anxiety, which everyone experiences at some point in their lives in response to specific life events and activities. Normal anxiety actually helps [place] prepare for and get through these life events and activities. Normal anxiety goes away after the event or activity is over.  GAD causes anxiety that is not necessarily related to specific events or activities. It also causes excess anxiety in proportion to specific events or activities. The anxiety associated with GAD is also difficult to control. GAD can vary from mild to severe. People with severe GAD can have intense waves of anxiety with physical symptoms (panic attacks).  SYMPTOMS The anxiety and worry associated with GAD are difficult to control. This anxiety and worry are related to many life events and activities and also occur more days than not for 6 months or longer. People with GAD also have three or more of the following symptoms (one or more in children):  Restlessness.   Fatigue.  Difficulty concentrating.   Irritability.  Muscle tension.  Difficulty sleeping or unsatisfying sleep. DIAGNOSIS GAD is diagnosed through an assessment by your health care provider. Your health care provider will ask you questions aboutyour mood,physical symptoms, and events in your life. Your health care provider may ask you about your medical history and use of alcohol or drugs, including prescription medicines. Your health care provider may also do a physical exam and blood tests. Certain medical conditions and the use of certain substances can cause symptoms similar to those associated with GAD. Your health care provider may refer you to a mental health specialist for further evaluation. TREATMENT The following therapies are usually used to treat GAD:    Medication. Antidepressant medication usually is prescribed for long-term daily control. Antianxiety medicines may be added in severe cases, especially when panic attacks occur.   Talk therapy (psychotherapy). Certain types of talk therapy can be helpful in treating GAD by providing support, education, and guidance. A form of talk therapy called cognitive behavioral therapy can teach you healthy ways to think about and react to daily life events and activities.  Stress managementtechniques. These include yoga, meditation, and exercise and can be very helpful when they are practiced regularly. A mental health specialist can help determine which treatment is best for you. Some people see improvement with one therapy. However, other people require a combination of therapies.   This information is not intended to replace advice given to you by your health care provider. Make sure you discuss any questions you have with your health care provider.   Document Released: 12/04/2012 Document Revised: 08/30/2014 Document Reviewed: 12/04/2012 Elsevier Interactive Patient Education [employer]   Emergency Department Resource Guide 1) Find a Doctor and Pay Out of Pocket Although you won't have to find out who is covered by your insurance plan, it is a good idea to ask around and get recommendations. You will then need to call the office and see if the doctor you have chosen will accept you as a new patient and what types of options they offer for patients who are self-pay. Some doctors offer discounts or will set up payment plans for their patients who do not have insurance, but you will need to ask so you aren't surprised when you get to your appointment.  2) Contact Your Local Health Department Not all health departments have doctors that can see  patients for sick visits, but many do, so it is worth a call to see if yours does. If you don't know where your local health department is, you can  check in your phone book. The CDC also has a tool to help you locate your state's health department, and many state websites also have listings of all of their local health departments.  3) Find a Walk-in Clinic If your illness is not likely to be very severe or complicated, you may want to try a walk in clinic. These are popping up all over the country in pharmacies, drugstores, and shopping centers. They're usually staffed by nurse practitioners or physician assistants that have been trained to treat common illnesses and complaints. They're usually fairly quick and inexpensive. However, if you have serious medical issues or chronic medical problems, these are probably not your best option.  No Primary Care Doctor: - Call Health Connect at  [phone] - they can help you locate a primary care doctor that  accepts your insurance, provides certain services, etc. - Physician Referral Service- [phone]  Chronic Pain Problems: Organization         Address  Phone   Notes  [facility]  [phone] Patients need to be referred by their primary care doctor.   Medication Assistance: Organization         Address  Phone   Notes  [facility] [address] [place], [place] [address] [phone]) [phone] --Must be a resident of [facility] -- Must have NO insurance coverage whatsoever (no Medicaid/ Medicare, etc.) -- The pt. MUST have a primary care doctor that directs their care regularly and follows them in the community   Med[facility]ist  [phone]   [employer]  [phone]) [phone]    Agencies that provide inexpensive medical care: Organization         Address  Phone   Notes  [facility]  [phone]) [phone]   [facility]    [phone]) [phone]   [facility] [address] [place], [place] [address] [phone]   [facility] [address], [place] [phone]    [facility]    [phone]) [phone]   [facility]    [phone]   [facility]  [address], [place] Phone:  [phone]) [phone], Fax:  [phone]) [phone] Hours of Operation:  9 am - 6 pm, M-F.  Also accepts Medicaid/Medicare and self-pay.  [facility]  [address], [place] Phone: [phone]) [phone], Fax: [phone]) [phone]. Hours of Operation:  8:30 am - 5:30 pm, M-F.  Also accepts Medicaid and self-pay.  [facility] [place] [address], [place] Phone: [phone]) [phone]   [facility] [address], [place], [place] [phone], Ext. [phone] Mondays & Thursdays: 7-9 AM.  First 15 patients are seen on a first come, first serve basis.    Medicaid-accepting [facility] Providers:  Organization         Address  Phone   Notes  [facility] [address], [place] [phone] Also accepts self-pay patients.  [facility] [address], [place]  [phone]   [facility] [address], [place] [phone]) [phone]   [facility] [address] [place] [address], [place] (  [phone]   Renaye Rakers [address], [place]   [phone] Only accepts [place] Access [place] patients after they have their name applied to their card.   Self-Pay (no insurance) in [facility]:  Organization         Address  Phone   Notes  Sickle Cell Patients, [facility] [address] [place], [place] [phone]   [facility] [address] [place], [place] [phone]   [facility] [place]  [address], [place] [phone]   [facility]/Dr. Osei-Bonsu  [address] [place] or [address], [place] [phone] Phone number for both [place] and [place] locations is the  same.  [facility] and [facility] [address], [place] [phone]   [facility] [address], [place] or [address] [phone]   [facility] [address], [place] [phone], phone; [phone], fax Sees patients 1st and 3rd Saturday of every month.  Must not qualify for public or private insurance (i.e. Medicaid, Medicare, [place] Health Choice, Veterans' Benefits)  Household income should be no more than 200% of the poverty level The clinic cannot treat you if you are pregnant or think you are pregnant  Sexually transmitted diseases are not treated at the clinic.    Dental Care: Organization         Address  Phone  Notes  [facility] of [facility] [address] [place], [place] [phone] Accepts children up to age 24 who are enrolled in [place] or [place] Health Choice; pregnant women with a Medicaid card; and children who have applied for Medicaid or  Health Choice, but were declined, whose parents can pay a reduced fee at time of service.  [facility]  [address], [place] [phone] Accepts children up to age 67 who are enrolled in [place] or  Health Choice; pregnant women with a Medicaid card; and children who have applied for Medicaid or  Health Choice, but were declined, whose parents can pay a reduced fee at time of service.  [facility]  [address] [place], [place] [phone] Patients are seen by appointment only. Walk-ins are not accepted. [facility] will see patients 53 years of age and older. Monday - Tuesday (8am-5pm) Most Wednesdays (8:30-5pm) $30 per visit, cash only  [facility]  [address], [facility] [phone] Patients are seen by appointment only. Walk-ins are not accepted. [facility] will see patients  36 years of age and older. One Wednesday Evening (Monthly: Volunteer Based).  $30 per visit, cash only  [employer]  [phone] for adults; Children under age 60, call Graduate Pediatric Dentistry at [phone]. Children aged 89-14, please call [phone] to request a pediatric application.  Dental services are provided in all areas of dental care including fillings, crowns and bridges, complete and partial dentures, implants, gum treatment, root canals, and extractions. Preventive care is also provided. Treatment is provided to both adults and children. Patients are selected via a lottery and there is often a waiting list.   [facility] [address] [place]  [phone] [email or web address] farm manager Dental [address], [place], [place] [phone], Ext. [phone] Second and Fourth Thursday of each month, opens at 6:30 AM; Clinic ends at 9 AM.  Patients are seen on a first-come first-served basis, and a limited number are seen during each clinic.  ° °[facility] ° [address], [place], [place] [phone]   Eligibility Requirements °You must have lived in [place], [place], or [place] counties for at least the last three months. °  You cannot be eligible for state or federal sponsored healthcare insurance, including Veterans Administration, Medicaid, or Medicare. °  You generally cannot be eligible for healthcare insurance through your employer.  °  How to apply: °Eligibility screenings are held every Tuesday and Wednesday afternoon from 1:00 pm until 4:00 pm. You do not need an appointment for the interview!  °[facility] [address], [place], [place] [phone]   °[facility]  336-342-8273   °[facility]  336-703-3100   °[facility]  336-570-6415   ° °Behavioral Health Resources in the Community: °Intensive Outpatient  Programs °Organization         Address  Phone  Notes  °[facility] [address], [place], [place] [phone]   °[facility] [address], [place], [place] [phone]   °[facility] [address], [place], [place] ° 336-882-2125   °[facility] [address],  °[place], [place] 1-800-853-5163 or 336-641-4981   °Substance Abuse Resources °Organization         Address  Phone  Notes  °[facility]  336-882-2125   °[facility]  336-784-9470   °[facility]  336-285-9073   °[facility]  336-845-3988   °Residential & Outpatient Substance Abuse Program  1-800-659-3381   °Psychological Services °Organization         Address  Phone  Notes  °[facility]  336- 832-9600   °[facility]  336- 378-7881   °[facility] [address], [place] 1-800-853-5163 or 336-641-4981   ° °Mobile Crisis Teams °Organization         Address  Phone  Notes  °[facility], Mobile Crisis Care Unit  1-877-626-1772   °Assertive °Psychotherapeutic Services ° [address] [place], [place] 336-834-9664   °Sharon DeEsch [address] °[place] 336-554-5454   ° °Self-Help/Support Groups °Organization         Address  Phone             Notes  °[facility] [place] - variety of support groups  336- 373-1402 Call for more information  °Narcotics Anonymous (NA), [facility] [address], °[place]  2 meetings at this location  ° °Residential Treatment Programs °Organization         Address  Phone  Notes  °[facility] [address],    °[place]  1-866-801-8205   °[facility] ° [address], [place], [place] 704-293-8524   °[facility] [address], [place] 336-845-3988 Admissions: 8am-3pm M-F  °[facility] [address]    °[place], [place]  336-841-1104   °[facility] [address], [place], [place] 336-379-7146   °[facility] [address]  °[place], [place] 336-285-9073   °[facility] - Intensive Outpatient [address], [place], [place] 336-852-3033   °  [facility] ([facility] [address]  °[place], [place] 1-877-615-2722 or 336-784-9470   °[facility] ([facility]) [address] [place], [place] 336-227-7417 Accepts Medicaid  °[facility] [address]  °[place] 1-800-659-3381 Substance Abuse/Addiction Treatment  ° °[place] Behavioral Health Resources °Organization         Address  Phone  Notes  °[facility]  (888) 581-9988   °Julie Brannon, PhD [address] [place], [place]   (336) 349-5553 or (336) 951-0000   °[facility]   [address] °[place], [place] (336) 349-4454   °[facility] [address], [place], [place] (336) 342-8316 Insurance/Medicaid/sponsorship through [facility]  °[facility] [address]                                    [place], [place] (336) 342-8316 Therapy/tele-psych/case  °[facility] [address]  ° [place], [place] (336) 349-2233    °Dr. Arfeen  (336) 349-4544   °[facility] [place]  [employer] [place] [facility] 1) [address], [place] °2) [address], [place] °3)  [address], [place] (336) 349-3220 °(336) 342-7768 ° °(336) 342-8140   °[place] Child Abuse Hotline (336) 342-1394 or (336) 342-3537 (After Hours)    ° ° ° °

## 2015-08-28 ENCOUNTER — Encounter (HOSPITAL_COMMUNITY): Payer: Self-pay | Admitting: *Deleted

## 2015-08-28 ENCOUNTER — Emergency Department (HOSPITAL_COMMUNITY)
Admission: EM | Admit: 2015-08-28 | Discharge: 2015-08-28 | Disposition: A | Discharge status: Home / Self Care | Payer: Managed Care, Other (non HMO) | Attending: Emergency Medicine | Admitting: Emergency Medicine

## 2015-08-28 DIAGNOSIS — Z87891 Personal history of nicotine dependence: Secondary | ICD-10-CM | POA: Diagnosis not present

## 2015-08-28 DIAGNOSIS — F419 Anxiety disorder, unspecified: Secondary | ICD-10-CM | POA: Insufficient documentation

## 2015-08-28 DIAGNOSIS — M545 Low back pain: Secondary | ICD-10-CM | POA: Insufficient documentation

## 2015-08-28 DIAGNOSIS — M5412 Radiculopathy, cervical region: Secondary | ICD-10-CM

## 2015-08-28 DIAGNOSIS — Z79899 Other long term (current) drug therapy: Secondary | ICD-10-CM | POA: Diagnosis not present

## 2015-08-28 DIAGNOSIS — M542 Cervicalgia: Secondary | ICD-10-CM | POA: Diagnosis present

## 2015-08-28 DIAGNOSIS — Z880 Allergy status to penicillin: Secondary | ICD-10-CM | POA: Insufficient documentation

## 2015-08-28 MED ORDER — PREDNISONE 20 MG PO TABS: 40.0000 mg | ORAL_TABLET | Freq: Every day | ORAL | Status: DC

## 2015-08-28 NOTE — ED Notes (Signed)
SEE PA assessment 

## 2015-08-28 NOTE — ED Notes (Signed)
Declined W/C at D/C and was escorted to lobby by RN. 

## 2015-08-28 NOTE — Discharge Instructions (Signed)

## 2015-08-28 NOTE — ED Notes (Signed)
Pt reports neck pain
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
## 2015-08-29 ENCOUNTER — Emergency Department (HOSPITAL_COMMUNITY)
Admission: EM | Admit: 2015-08-29 | Discharge: 2015-08-29 | Disposition: A | Discharge status: Home / Self Care | Payer: Managed Care, Other (non HMO) | Source: Home / Self Care | Attending: Family Medicine | Admitting: Family Medicine

## 2015-08-29 ENCOUNTER — Encounter (HOSPITAL_COMMUNITY): Payer: Self-pay | Admitting: Emergency Medicine

## 2015-08-29 DIAGNOSIS — M549 Dorsalgia, unspecified: Secondary | ICD-10-CM

## 2015-08-29 NOTE — ED Provider Notes (Signed)
CSN: [id number]     Arrival date & time 08/29/15  1311 History   First MD Initiated Contact with Patient 08/29/15 1436     Chief Complaint  Patient presents with  . Back Pain   (Consider location/radiation/quality/duration/timing/severity/associated sxs/prior Treatment) HPI Comments: 25 year old female with chronic back pain for 4-5 months presents to the urgent care requesting to have an MRI of her spine. She states she is having midline pain over her spine. Although she had an injury 4-5 months ago there has not been any known recent injury. She was seen and evaluated for the same complaint and she department yesterday. There were no abnormal neurologic findings. And the patient was prescribed steroidal's. She was also advised that she may need to have an MRI but would need to have this word from a primary care provider. He was also noted that she had no urgent findings that require an emergent MRI. As we were discussing her history the patient stated that she had given all this to the emergency department and that the reason she was here was for the salt purpose of obtaining an MRI. When I advised that we did not have an MRI machine in this building and that we could not or an MRI she stood up briskly gathered her items and walk briskly out of the room and into the lobby. Her gait was normal, balanced and rapid. She demonstrated movement of all her extremities and good muscle tone during these activities. She did not request an examination.   Past Medical History  Diagnosis Date  . Allergy   . Strep throat   . Anxiety    History reviewed. No pertinent past surgical history. Family History  Problem Relation Age of Onset  . Diabetes Father   . Stroke Father    Social History  Substance Use Topics  . Smoking status: Former Smoker    Types: Pipe, Software engineer  . Smokeless tobacco: None  . Alcohol Use: Yes   OB History    No data available     Review of Systems  Musculoskeletal: Positive  for back pain.    Allergies  Clindamycin/lincomycin and Penicillins  Home Medications   Prior to Admission medications   Medication Sig Start Date End Date Taking? Authorizing Provider  antipyrine-benzocaine Lyla Son) otic solution Place 3-4 drops into both ears as needed for ear pain. 07/10/14   Graylon Good, PA-C  cefdinir (OMNICEF) 300 MG capsule Take 1 capsule (300 mg total) by mouth 2 (two) times daily. 07/10/14   Adrian Blackwater Baker, PA-C  cephALEXin (KEFLEX) 500 MG capsule Take 1 capsule (500 mg total) by mouth 3 (three) times daily. 05/16/14   Blane Ohara, MD  Chlorpheniramine-PSE-Ibuprofen (ADVIL ALLERGY SINUS) 2-30-200 MG TABS 1-2 tabs PO Q4-6 hrs PRN 07/10/14   Graylon Good, PA-C  doxycycline (VIBRAMYCIN) 100 MG capsule Take 1 capsule (100 mg total) by mouth 2 (two) times daily. 05/05/15   Linna Hoff, MD  etonogestrel (IMPLANON) 68 MG IMPL implant Inject 1 each into the skin once.    Historical Provider, MD  fluticasone (FLONASE) 50 MCG/ACT nasal spray Place 1 spray into both nostrils 2 (two) times daily. 05/05/15   Linna Hoff, MD  hydrOXYzine (ATARAX/VISTARIL) 25 MG tablet Take 1 tablet (25 mg total) by mouth every 6 (six) hours as needed for anxiety. 07/04/15   Fayrene Helper, PA-C  ibuprofen (ADVIL,MOTRIN) 600 MG tablet Take 1 tablet (600 mg total) by mouth every 6 (six) hours as needed.  05/16/14   Blane OharaJoshua Zavitz, MD  ondansetron (ZOFRAN ODT) 4 MG disintegrating tablet 4mg  ODT q4 hours prn nausea/vomit 05/16/14   Blane OharaJoshua Zavitz, MD  predniSONE (DELTASONE) 20 MG tablet Take 2 tablets (40 mg total) by mouth daily. 08/28/15   Roxy Horsemanobert Browning, PA-C   Meds Ordered and Administered this Visit  Medications - No data to display  BP 115/77 mmHg  Pulse 85  Temp(Src) 97.9 F (36.6 C) (Oral)  Resp 16  SpO2 98% No data found.   Physical Exam  Constitutional: She is oriented to person, place, and time. She appears well-developed and well-nourished. No distress.  Cardiovascular:  Normal rate.   Pulmonary/Chest: Effort normal. No respiratory distress.  Musculoskeletal:  Observation reveals patient moved all extremities without apparent weakness or abnormal coordination. No additional examination was made. This by the request of the patient.  Neurological: She is alert and oriented to person, place, and time.  Skin: Skin is warm and dry.  Psychiatric: Her affect is blunt. She is agitated. She expresses impulsivity.  Nursing note and vitals reviewed.   ED Course  Procedures (including critical care time)  Labs Review Labs Reviewed - No data to display  Imaging Review No results found.   Visual Acuity Review  Right Eye Distance:   Left Eye Distance:   Bilateral Distance:    Right Eye Near:   Left Eye Near:    Bilateral Near:         MDM   1. Back pain, unspecified location    The procedure to obtain an MRI was explained to the patient. She must obtain a primary care provider to be referred to a specialist if necessary. PCP also may be able to wear an MRI if indicated by examination and history. The patient states that since she came here for the sole purpose of having MRI of the back and other imaging and that we are unable to provide these for her she would simply leave.     Hayden Rasmussenavid Pedro Oldenburg, NP 08/29/15 1517

## 2015-08-29 NOTE — ED Notes (Signed)
Advised pt we did not have an mri at this location.

## 2015-08-29 NOTE — ED Notes (Signed)
Pt left while provider still in the room with her
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
## 2015-09-08 ENCOUNTER — Ambulatory Visit: Payer: Self-pay | Admitting: Family Medicine

## 2015-10-06 ENCOUNTER — Encounter: Payer: Self-pay | Admitting: Diagnostic Neuroimaging

## 2015-10-06 ENCOUNTER — Ambulatory Visit (INDEPENDENT_AMBULATORY_CARE_PROVIDER_SITE_OTHER): Payer: Managed Care, Other (non HMO) | Admitting: Diagnostic Neuroimaging

## 2015-10-06 ENCOUNTER — Encounter: Payer: Self-pay | Admitting: *Deleted

## 2015-10-06 VITALS — BP 117/72 | HR 83 | Ht 60.5 in | Wt 160.6 lb

## 2015-10-06 DIAGNOSIS — F411 Generalized anxiety disorder: Secondary | ICD-10-CM | POA: Diagnosis not present

## 2015-10-06 DIAGNOSIS — M546 Pain in thoracic spine: Secondary | ICD-10-CM | POA: Diagnosis not present

## 2015-10-06 DIAGNOSIS — M542 Cervicalgia: Secondary | ICD-10-CM | POA: Diagnosis not present
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
## 2015-10-06 NOTE — Patient Instructions (Signed)
Thank you for coming to see [place] at [facility]. I hope we have been able to provide you high quality care today.  You may receive a patient satisfaction survey over the next few weeks. We would appreciate your feedback and comments so that we may continue to improve ourselves and the health of our patients.  - continue PT evaluation - I will refer you to psychiatry/-psychology for anxiety treatment - try gentle exercises (yoga, water therapy, stretching)   ~~~~~~~~~~~~~~~~~~~~~~~~~~~~~~~~~~~~~~~~~~~~~~~~~~~~~~~~~~~~~~~~~  DR. Arielle Eber'S GUIDE TO HAPPY AND HEALTHY LIVING These are some of my general health and wellness recommendations. Some of them may apply to you better than others. Please use common sense as you try these suggestions and feel free to ask me any questions.   ACTIVITY/FITNESS Mental, social, emotional and physical stimulation are very important for brain and body health. Try learning a new activity (arts, music, language, sports, games).  Keep moving your body to the best of your abilities. You can do this at home, inside or outside, the park, community center, gym or anywhere you like. Consider a physical therapist or personal trainer to get started. Consider the app Sworkit. Fitness trackers such as smart-watches, smart-phones or Fitbits can help as well.   NUTRITION Eat more plants: colorful vegetables, nuts, seeds and berries.  Eat less sugar, salt, preservatives and processed foods.  Avoid toxins such as cigarettes and alcohol.  Drink water when you are thirsty. Warm water with a slice of lemon is an excellent morning drink to start the day.  Consider these websites for more information The Nutrition Source (https://www.henry-hernandez.biz/) Precision Nutrition (WindowBlog.ch)   RELAXATION Consider practicing mindfulness meditation or other relaxation techniques such as deep breathing, prayer,  yoga, tai chi, massage. See website mindful.org or the apps Headspace or Calm to help get started.   SLEEP Try to get at least 7-8+ hours sleep per day. Regular exercise and reduced caffeine will help you sleep better. Practice good sleep hygeine techniques. See website sleep.org for more information.   PLANNING Prepare estate planning, living will, healthcare POA documents. Sometimes this is best planned with the help of an attorney. Theconversationproject.org and agingwithdignity.org are excellent resources.

## 2015-12-24 ENCOUNTER — Encounter (HOSPITAL_COMMUNITY): Payer: Self-pay | Admitting: Family Medicine

## 2015-12-24 ENCOUNTER — Emergency Department (HOSPITAL_COMMUNITY)
Admission: EM | Admit: 2015-12-24 | Discharge: 2015-12-24 | Disposition: A | Discharge status: Home / Self Care | Payer: Managed Care, Other (non HMO) | Attending: Emergency Medicine | Admitting: Emergency Medicine

## 2015-12-24 ENCOUNTER — Emergency Department (HOSPITAL_COMMUNITY): Payer: Managed Care, Other (non HMO)

## 2015-12-24 DIAGNOSIS — S4992XA Unspecified injury of left shoulder and upper arm, initial encounter: Secondary | ICD-10-CM | POA: Diagnosis not present

## 2015-12-24 DIAGNOSIS — Y998 Other external cause status: Secondary | ICD-10-CM | POA: Insufficient documentation

## 2015-12-24 DIAGNOSIS — F17210 Nicotine dependence, cigarettes, uncomplicated: Secondary | ICD-10-CM | POA: Diagnosis not present

## 2015-12-24 DIAGNOSIS — Y9289 Other specified places as the place of occurrence of the external cause: Secondary | ICD-10-CM | POA: Insufficient documentation

## 2015-12-24 DIAGNOSIS — Y9389 Activity, other specified: Secondary | ICD-10-CM | POA: Diagnosis not present

## 2015-12-24 DIAGNOSIS — X58XXXA Exposure to other specified factors, initial encounter: Secondary | ICD-10-CM | POA: Diagnosis not present
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
## 2016-03-29 ENCOUNTER — Encounter: Payer: Self-pay | Admitting: *Deleted

## 2016-03-30 ENCOUNTER — Institutional Professional Consult (permissible substitution): Payer: Managed Care, Other (non HMO) | Admitting: Diagnostic Neuroimaging

## 2016-03-31 ENCOUNTER — Encounter: Payer: Self-pay | Admitting: Diagnostic Neuroimaging

## 2016-08-19 ENCOUNTER — Other Ambulatory Visit: Payer: Self-pay | Admitting: Family Medicine

## 2016-08-19 DIAGNOSIS — R591 Generalized enlarged lymph nodes: Secondary | ICD-10-CM

## 2016-08-20 ENCOUNTER — Ambulatory Visit
Admission: RE | Admit: 2016-08-20 | Discharge: 2016-08-20 | Disposition: A | Discharge status: Home / Self Care | Payer: Managed Care, Other (non HMO) | Source: Ambulatory Visit | Attending: Family Medicine | Admitting: Family Medicine

## 2016-08-20 DIAGNOSIS — R591 Generalized enlarged lymph nodes: Secondary | ICD-10-CM

## 2016-11-29 LAB — VITAMIN D 25 HYDROXY (VIT D DEFICIENCY, FRACTURES): Vit D, 25-Hydroxy: 8.4

## 2017-02-02 ENCOUNTER — Encounter (HOSPITAL_COMMUNITY): Payer: Self-pay | Admitting: *Deleted

## 2017-02-02 ENCOUNTER — Emergency Department (HOSPITAL_COMMUNITY)
Admission: EM | Admit: 2017-02-02 | Discharge: 2017-02-02 | Disposition: A | Discharge status: Home / Self Care | Payer: 59 | Attending: Emergency Medicine | Admitting: Emergency Medicine

## 2017-02-02 DIAGNOSIS — R197 Diarrhea, unspecified: Secondary | ICD-10-CM | POA: Diagnosis not present

## 2017-02-02 DIAGNOSIS — Z79899 Other long term (current) drug therapy: Secondary | ICD-10-CM | POA: Diagnosis not present

## 2017-02-02 DIAGNOSIS — R112 Nausea with vomiting, unspecified: Secondary | ICD-10-CM | POA: Insufficient documentation

## 2017-02-02 DIAGNOSIS — F17290 Nicotine dependence, other tobacco product, uncomplicated: Secondary | ICD-10-CM | POA: Insufficient documentation

## 2017-02-02 DIAGNOSIS — R1110 Vomiting, unspecified: Secondary | ICD-10-CM | POA: Diagnosis present

## 2017-02-02 HISTORY — DX: Fibromyalgia: M79.7

## 2017-02-02 LAB — CBC
HEMATOCRIT: 42.7 % (ref 36.0–46.0)
HEMOGLOBIN: 14 g/dL (ref 12.0–15.0)
MCH: 28.7 pg (ref 26.0–34.0)
MCHC: 32.8 g/dL (ref 30.0–36.0)
MCV: 87.7 fL (ref 78.0–100.0)
Platelets: 222 10*3/uL (ref 150–400)
RBC: 4.87 MIL/uL (ref 3.87–5.11)
RDW: 14.2 % (ref 11.5–15.5)
WBC: 12.7 10*3/uL — AB (ref 4.0–10.5)

## 2017-02-02 LAB — COMPREHENSIVE METABOLIC PANEL
ALBUMIN: 4.6 g/dL (ref 3.5–5.0)
ALT: 27 U/L (ref 14–54)
ANION GAP: 10 (ref 5–15)
AST: 25 U/L (ref 15–41)
Alkaline Phosphatase: 70 U/L (ref 38–126)
BILIRUBIN TOTAL: 0.7 mg/dL (ref 0.3–1.2)
BUN: 10 mg/dL (ref 6–20)
CO2: 21 mmol/L — ABNORMAL LOW (ref 22–32)
Calcium: 9.4 mg/dL (ref 8.9–10.3)
Chloride: 107 mmol/L (ref 101–111)
Creatinine, Ser: 0.53 mg/dL (ref 0.44–1.00)
GFR calc Af Amer: >60 mL/min (ref >60)
GFR calc non Af Amer: >60 mL/min (ref >60)
GLUCOSE: 132 mg/dL — AB (ref 65–99)
POTASSIUM: 3.9 mmol/L (ref 3.5–5.1)
Sodium: 138 mmol/L (ref 135–145)
TOTAL PROTEIN: 8.4 g/dL — AB (ref 6.5–8.1)

## 2017-02-02 LAB — URINALYSIS, ROUTINE W REFLEX MICROSCOPIC
BILIRUBIN URINE: NEGATIVE
Glucose, UA: NEGATIVE mg/dL
HGB URINE DIPSTICK: NEGATIVE
Ketones, ur: 20 mg/dL — AB
LEUKOCYTES UA: NEGATIVE
NITRITE: NEGATIVE
PROTEIN: 30 mg/dL — AB
Specific Gravity, Urine: 1.025 (ref 1.005–1.030)
pH: 7 (ref 5.0–8.0)

## 2017-02-02 LAB — I-STAT BETA HCG BLOOD, ED (MC, WL, AP ONLY)

## 2017-02-02 LAB — LIPASE, BLOOD: Lipase: 18 U/L (ref 11–51)

## 2017-02-02 MED ORDER — KETOROLAC TROMETHAMINE 30 MG/ML IJ SOLN
30.0000 mg | Freq: Once | INTRAMUSCULAR | Status: AC
  Administered 2017-02-02: 30 mg via INTRAVENOUS
  Filled 2017-02-02: qty 1

## 2017-02-02 MED ORDER — LORAZEPAM 2 MG/ML IJ SOLN
0.5000 mg | Freq: Once | INTRAMUSCULAR | Status: AC
  Administered 2017-02-02: 0.5 mg via INTRAVENOUS
  Filled 2017-02-02: qty 1

## 2017-02-02 MED ORDER — ONDANSETRON 4 MG PO TBDP
4.0000 mg | ORAL_TABLET | Freq: Once | ORAL | Status: AC | PRN
  Administered 2017-02-02: 4 mg via ORAL

## 2017-02-02 MED ORDER — ONDANSETRON HCL 4 MG PO TABS: 4.0000 mg | ORAL_TABLET | Freq: Four times a day (QID) | ORAL | 0 refills | Status: DC

## 2017-02-02 MED ORDER — SODIUM CHLORIDE 0.9% FLUSH: 10.0000 mL | INTRAVENOUS | Status: DC | PRN

## 2017-02-02 MED ORDER — SODIUM CHLORIDE 0.9 % IV BOLUS (SEPSIS)
1000.0000 mL | Freq: Once | INTRAVENOUS | Status: AC
  Administered 2017-02-02: 1000 mL via INTRAVENOUS

## 2017-02-02 MED ORDER — ONDANSETRON HCL 4 MG/2ML IJ SOLN
4.0000 mg | Freq: Once | INTRAMUSCULAR | Status: AC
  Administered 2017-02-02: 4 mg via INTRAVENOUS
  Filled 2017-02-02: qty 2

## 2017-02-02 MED ORDER — ONDANSETRON 4 MG PO TBDP
ORAL_TABLET | ORAL | Status: AC
  Filled 2017-02-02: qty 1

## 2017-02-02 NOTE — ED Notes (Signed)
States shortness of breath is resolving and chest pain now pressure 5/10. Airway intact bilateral equal chest rise and fall.

## 2017-02-02 NOTE — ED Notes (Signed)
Nurse tech and Nurse attempted to obtain EKG with room monitor was not working properly. Used portable EKG machine instead.

## 2017-02-02 NOTE — ED Triage Notes (Signed)
Pt reports n/v/d since she woke up this am. Has generalized abd pain.
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
## 2017-02-02 NOTE — Discharge Instructions (Signed)
Medications: Zofran  Treatment: Take Zofran every 6 hours as needed for nausea or vomiting. Begin with clear fluids and progress to bland foods like bananas, rice, applesauce, toast. Avoid greasy, spicy, fatty foods until your feeling better. I recommend hydrating with Pedialyte and water.  Follow-up: Please return to the emergency department if you develop any new or worsening symptoms including fever over 100.4, localizing abdominal pain, intractable vomiting, or any other new or concerning symptoms.

## 2017-02-02 NOTE — ED Provider Notes (Signed)
[facility] Provider Note   CSN: [id number] Arrival date & time: 02/02/17  1143  By signing my name below, I, Robin Richmond, attest that this documentation has been prepared under the direction and in the presence of non-physician practitioner, Orie Cuttino, PA-C. Electronically Signed: Rosana Richmond, ED Scribe. 02/02/17. 12:54 PM.  History   Chief Complaint Chief Complaint  Patient presents with  . Emesis  . Diarrhea   The history is provided by the patient. No language interpreter was used.  Diarrhea   Associated symptoms include abdominal pain and vomiting. Pertinent negatives include no chills and no headaches.   HPI Comments: Robin Richmond is a 26 y.o. female who presents to the Emergency Department complaining of persistent episodes of emesis and diarrhea onset this morning. Pt states she has vomited continuously every 30 minutes and has diarrhea every hour. Per pt, she consumed alcohol (1 Four Loko) and a new sushi restaurant last night. Pt reports associated nausea and generalized abdominal pain that comes before and after her BMs. Pt denies contact with any similar symptoms. Pt denies blood in stool, hematemesis, fever, CP, trouble breathing, urinary symptoms, vaginal discharge or any other complaints at this time.   Past Medical History:  Diagnosis Date  . Allergy   . Anxiety   . Depression   . Fibromyalgia   . Strep throat     There are no active problems to display for this patient.   Past Surgical History:  Procedure Laterality Date  . WISDOM TOOTH EXTRACTION      OB History    No data available       Home Medications    Prior to Admission medications   Medication Sig Start Date End Date Taking? Authorizing Provider  fluticasone (FLONASE) 50 MCG/ACT nasal spray Place 1 spray into both nostrils 2 (two) times daily. 05/05/15   Linna Hoff, MD  ibuprofen (ADVIL,MOTRIN) 600 MG tablet Take 1 tablet (600 mg total) by mouth every 6 (six) hours  as needed. Patient not taking: Reported on 10/06/2015 05/16/14   Blane Ohara, MD  ondansetron (ZOFRAN) 4 MG tablet Take 1 tablet (4 mg total) by mouth every 6 (six) hours. 02/02/17   Jermichael Belmares, Waylan Boga, PA-C  predniSONE (DELTASONE) 20 MG tablet Take 2 tablets (40 mg total) by mouth daily. 08/28/15   Roxy Horseman, PA-C    Family History Family History  Problem Relation Age of Onset  . Diabetes Father   . Stroke Father     Social History Social History  Substance Use Topics  . Smoking status: Current Some Day Smoker    Types: Pipe, Cigars  . Smokeless tobacco: Not on file     Comment: 10/06/15 Black and Mild  . Alcohol use Yes     Comment: occass     Allergies   Clindamycin/lincomycin; Cyclobenzaprine; and Penicillins   Review of Systems Review of Systems  Constitutional: Negative for chills and fever.  HENT: Negative for facial swelling and sore throat.   Respiratory: Negative for shortness of breath.   Cardiovascular: Negative for chest pain.  Gastrointestinal: Positive for abdominal pain, diarrhea, nausea and vomiting. Negative for blood in stool.  Genitourinary: Negative for difficulty urinating, dysuria, enuresis, frequency, urgency and vaginal discharge.  Musculoskeletal: Negative for back pain.  Skin: Negative for rash and wound.  Neurological: Negative for headaches.  Psychiatric/Behavioral: The patient is not nervous/anxious.      Physical Exam Updated Vital Signs BP 131/84 (BP Location: Right Arm)   Pulse 78  Temp 97.4 F (36.3 C) (Oral)   Resp 18   SpO2 100%   Physical Exam  Constitutional: She is oriented to person, place, and time. She appears well-developed and well-nourished. No distress.  HENT:  Head: Normocephalic and atraumatic.  Mouth/Throat: Oropharynx is clear and moist. No oropharyngeal exudate.  Eyes: Conjunctivae are normal. Pupils are equal, round, and reactive to light. Right eye exhibits no discharge. Left eye exhibits no discharge.  No scleral icterus.  Neck: Normal range of motion. Neck supple. No thyromegaly present.  Cardiovascular: Normal rate, regular rhythm, normal heart sounds and intact distal pulses.  Exam reveals no gallop and no friction rub.   No murmur heard. Pulmonary/Chest: Effort normal and breath sounds normal. No stridor. No respiratory distress. She has no wheezes. She has no rales.  Abdominal: Soft. Bowel sounds are normal. She exhibits no distension. There is tenderness in the left lower quadrant. There is no rebound and no guarding.  Mild LLQ tenderness  Musculoskeletal: She exhibits no edema.  Lymphadenopathy:    She has no cervical adenopathy.  Neurological: She is alert and oriented to person, place, and time. She exhibits normal muscle tone. Coordination normal.  Skin: Skin is warm and dry. No rash noted. She is not diaphoretic. No erythema. No pallor.  Psychiatric: She has a normal mood and affect. Her behavior is normal.  Nursing note and vitals reviewed.    ED Treatments / Results  DIAGNOSTIC STUDIES: Oxygen Saturation is 100% on RA, normal by my interpretation.   COORDINATION OF CARE: 12:40 PM-Discussed next steps with including IV fluids and nausea medicine. Pt verbalized understanding and is agreeable with the plan.   Labs (all labs ordered are listed, but only abnormal results are displayed) Labs Reviewed  COMPREHENSIVE METABOLIC PANEL - Abnormal; Notable for the following:       Result Value   CO2 21 (*)    Glucose, Bld 132 (*)    Total Protein 8.4 (*)    All other components within normal limits  CBC - Abnormal; Notable for the following:    WBC 12.7 (*)    All other components within normal limits  URINALYSIS, ROUTINE W REFLEX MICROSCOPIC - Abnormal; Notable for the following:    APPearance HAZY (*)    Ketones, ur 20 (*)    Protein, ur 30 (*)    Bacteria, UA FEW (*)    Squamous Epithelial / LPF 6-30 (*)    All other components within normal limits  LIPASE, BLOOD    I-STAT BETA HCG BLOOD, ED ([facility], [facility], [facility] ONLY)    EKG  EKG Interpretation None       Radiology No results found.  Procedures Procedures (including critical care time)  Medications Ordered in ED Medications  ondansetron (ZOFRAN-ODT) 4 MG disintegrating tablet (not administered)  sodium chloride flush (NS) 0.9 % injection 10-40 mL (not administered)  ondansetron (ZOFRAN-ODT) disintegrating tablet 4 mg (4 mg Oral Given 02/02/17 1219)  sodium chloride 0.9 % bolus 1,000 mL (0 mLs Intravenous Stopped 02/02/17 1653)  ondansetron (ZOFRAN) injection 4 mg (4 mg Intravenous Given 02/02/17 1407)  ketorolac (TORADOL) 30 MG/ML injection 30 mg (30 mg Intravenous Given 02/02/17 1409)  LORazepam (ATIVAN) injection 0.5 mg (0.5 mg Intravenous Given 02/02/17 1452)  sodium chloride 0.9 % bolus 1,000 mL (0 mLs Intravenous Stopped 02/02/17 1855)     Initial Impression / Assessment and Plan / ED Course  I have reviewed the triage vital signs and the nursing notes.  Pertinent labs &  imaging results that were available during my care of the patient were reviewed by me and considered in my medical decision making (see chart for details).     After IV team placed IV and patient received Zofran, Toradol, normal saline fluid bolus, patient complaining of shortness of breath. On my evaluation in the room, patient is saturating 100% oxygen. She is also reporting tingling moving around her face. She is shaking. It is very cold in the room. She has blankets on. I will give her 0.5 mg Ativan for presumed anxiety. She denies chest pain.  On reevaluation after Ativan, patient is feeling much better. Patient was asking to eat and asked for french fries. CBC shows WBC 12.7, most likely due from significant vomiting. CMP shows CO2 21, glucose 132, protein 8.4. Lipase 18. UA shows 20 ketones, 30 protein. Patient's abdominal exam is benign. I feel patient's symptoms may be related to food or drink that the patient had last  night or a viral gastroenteritis. Patient is tolerating fluid and saltine crackers prior to discharge. We'll discharge patient home with Zofran. Strict return precautions discussed. Patient and mother understand and agree with plan. Patient vitals stable throughout ED course and discharged in satisfactory condition.  Final Clinical Impressions(s) / ED Diagnoses   Final diagnoses:  Nausea vomiting and diarrhea    New Prescriptions New Prescriptions   ONDANSETRON (ZOFRAN) 4 MG TABLET    Take 1 tablet (4 mg total) by mouth every 6 (six) hours.   I personally performed the services described in this documentation, which was scribed in my presence. The recorded information has been reviewed and is accurate.     Emi HolesLaw, Thais Silberstein M, PA-C 02/02/17 2016    Benjiman CorePickering, Nathan, MD 02/03/17 1459

## 2017-02-02 NOTE — ED Notes (Signed)
Attempted IV unable to obtain patient states it is difficult to obtain blood draws and IV on her especially when dehydrated.

## 2017-02-02 NOTE — ED Notes (Signed)
Patient feeling better talking with family member on phone.

## 2017-02-02 NOTE — ED Notes (Signed)
IV team at bedside 

## 2017-02-02 NOTE — ED Notes (Signed)
Provider at bedside

## 2017-04-28 ENCOUNTER — Other Ambulatory Visit: Payer: Self-pay | Admitting: Family Medicine

## 2017-04-28 DIAGNOSIS — N644 Mastodynia: Secondary | ICD-10-CM

## 2017-05-04 ENCOUNTER — Other Ambulatory Visit: Payer: Self-pay | Admitting: Family Medicine

## 2017-05-04 DIAGNOSIS — N644 Mastodynia: Secondary | ICD-10-CM

## 2017-05-12 ENCOUNTER — Encounter (HOSPITAL_COMMUNITY): Payer: Self-pay | Admitting: Emergency Medicine

## 2017-05-12 ENCOUNTER — Ambulatory Visit (HOSPITAL_COMMUNITY)
Admission: EM | Admit: 2017-05-12 | Discharge: 2017-05-12 | Disposition: A | Discharge status: Home / Self Care | Payer: 59 | Attending: Nurse Practitioner | Admitting: Nurse Practitioner

## 2017-05-12 DIAGNOSIS — M797 Fibromyalgia: Secondary | ICD-10-CM | POA: Diagnosis not present

## 2017-05-12 DIAGNOSIS — M791 Myalgia: Secondary | ICD-10-CM | POA: Diagnosis not present

## 2017-05-12 DIAGNOSIS — N644 Mastodynia: Secondary | ICD-10-CM

## 2017-05-12 MED ORDER — KETOROLAC TROMETHAMINE 60 MG/2ML IM SOLN
INTRAMUSCULAR | Status: AC
  Filled 2017-05-12: qty 2

## 2017-05-12 MED ORDER — TRAMADOL HCL 50 MG PO TABS: 50.0000 mg | ORAL_TABLET | Freq: Four times a day (QID) | ORAL | 0 refills | Status: DC | PRN

## 2017-05-12 MED ORDER — KETOROLAC TROMETHAMINE 60 MG/2ML IM SOLN
60.0000 mg | Freq: Once | INTRAMUSCULAR | Status: AC
  Administered 2017-05-12: 60 mg via INTRAMUSCULAR

## 2017-05-12 NOTE — ED Triage Notes (Signed)
PT reports headaches, generalized body pain, chest pain, breast pain, and slight dizzy spells for 2-3 days. PT reports fibromyalgia.

## 2017-05-12 NOTE — ED Provider Notes (Signed)
[facility]    CSN: [id number] Arrival date & time: 05/12/17  1801     History   Chief Complaint Chief Complaint  Patient presents with  . Breast Pain    HPI Robin Richmond is a 26 y.o. female.   26 year old female patient with history of fibromyalgia that presents with generalized body pain consistent with her fibromyalgia flares. Pain has been ongoing for the past couple of days. She rates it severe. She has tried over-the-counter agents without much relief in her symptoms. She has never been prescribed any therapies in the past for her condition. Patient also reports bilateral breast pain. This has been ongoing for several days now. Patient states that she is concerned that it may be cancer as her grandmother has recently been diagnosed and there is also other female members of the family with history of breast cancer. He was, sweats, chills, chest pain, shortness of breath, nausea or vomiting. Notably, the patient has an appointment with the breast Center on 05/17/2017.      Past Medical History:  Diagnosis Date  . Allergy   . Anxiety   . Depression   . Fibromyalgia   . Strep throat     There are no active problems to display for this patient.   Past Surgical History:  Procedure Laterality Date  . WISDOM TOOTH EXTRACTION      OB History    No data available       Home Medications    Prior to Admission medications   Medication Sig Start Date End Date Taking? Authorizing Provider  fluticasone (FLONASE) 50 MCG/ACT nasal spray Place 1 spray into both nostrils 2 (two) times daily. 05/05/15   Linna Hoff, MD  ibuprofen (ADVIL,MOTRIN) 600 MG tablet Take 1 tablet (600 mg total) by mouth every 6 (six) hours as needed. Patient not taking: Reported on 10/06/2015 05/16/14   Blane Ohara, MD  ondansetron (ZOFRAN) 4 MG tablet Take 1 tablet (4 mg total) by mouth every 6 (six) hours. 02/02/17   Law, Waylan Boga, PA-C  predniSONE (DELTASONE) 20 MG tablet Take 2  tablets (40 mg total) by mouth daily. 08/28/15   Roxy Horseman, PA-C    Family History Family History  Problem Relation Age of Onset  . Diabetes Father   . Stroke Father     Social History Social History  Substance Use Topics  . Smoking status: Current Some Day Smoker    Types: Pipe, Cigars  . Smokeless tobacco: Never Used     Comment: 10/06/15 Black and Mild  . Alcohol use Yes     Comment: occass     Allergies   Clindamycin/lincomycin; Cyclobenzaprine; and Penicillins   Review of Systems Review of Systems  Musculoskeletal: Positive for myalgias.       Bilateral breast pain  All other systems reviewed and are negative.    Physical Exam Triage Vital Signs ED Triage Vitals  Enc Vitals Group     BP 05/12/17 1845 (!) 122/52     Pulse Rate 05/12/17 1845 85     Resp 05/12/17 1845 16     Temp 05/12/17 1845 98.6 F (37 C)     Temp Source 05/12/17 1845 Oral     SpO2 05/12/17 1845 100 %     Weight 05/12/17 1846 160 lb (72.6 kg)     Height 05/12/17 1846 5' (1.524 m)     Head Circumference --      Peak Flow --  Pain Score 05/12/17 1846 7     Pain Loc --      Pain Edu? --      Excl. in GC? --    No data found.   Updated Vital Signs BP (!) 122/52   Pulse 85   Temp 98.6 F (37 C) (Oral)   Resp 16   Ht 5' (1.524 m)   Wt 160 lb (72.6 kg)   SpO2 100%   BMI 31.25 kg/m   Visual Acuity Right Eye Distance:   Left Eye Distance:   Bilateral Distance:    Right Eye Near:   Left Eye Near:    Bilateral Near:     Physical Exam  Constitutional: She is oriented to person, place, and time. She appears well-developed and well-nourished.  Neck: Normal range of motion.  Cardiovascular: Normal rate, regular rhythm and normal heart sounds.   Pulmonary/Chest: Effort normal and breath sounds normal.  Musculoskeletal: Normal range of motion.  Bilateral breast tenderness. No significant mass appreciated. No nipple discharge or pain.  Neurological: She is alert and  oriented to person, place, and time.  Skin: Skin is warm and dry.  Psychiatric: She has a normal mood and affect.     UC Treatments / Results  Labs (all labs ordered are listed, but only abnormal results are displayed) Labs Reviewed - No data to display  EKG  EKG Interpretation None       Radiology No results found.  Procedures Procedures (including critical care time)  Medications Ordered in UC Medications  ketorolac (TORADOL) injection 60 mg (not administered)     Initial Impression / Assessment and Plan / UC Course  I have reviewed the triage vital signs and the nursing notes.  Pertinent labs & imaging results that were available during my care of the patient were reviewed by me and considered in my medical decision making (see chart for details).    26 year old female patient with history of fibromyalgia that presents with generalized body pain consistent with her fibromyalgia flares and bilateral breast pain.  Patient has never been prescribed any therapies in the past for her fibromyalgia and has only use over-the-counter agents in the past. Trial of Toradol provided in the clinic with some relief. Will provide Rx for tramadol and referral to pain clinic. Patient advised to keep existing appointment with the breast care center   Discussed diagnosis and treatment with patient. All questions have been answered and all concerns have been addressed. The patient verbalized understanding and had no further questions   Final Clinical Impressions(s) / UC Diagnoses   Final diagnoses:  Fibromyalgia  Breast pain    New Prescriptions New Prescriptions   No medications on file     Controlled Substance Prescriptions [place] Controlled Substance Registry consulted? Yes, I have consulted the [place] Controlled Substances Registry for this patient, and feel the risk/benefit ratio today is favorable for proceeding with this prescription for a controlled substance.   Lurline Idol, [place] 05/12/17 [phone]

## 2017-05-17 ENCOUNTER — Ambulatory Visit
Admission: RE | Admit: 2017-05-17 | Discharge: 2017-05-17 | Disposition: A | Discharge status: Home / Self Care | Payer: 59 | Source: Ambulatory Visit | Attending: Family Medicine | Admitting: Family Medicine

## 2017-05-17 DIAGNOSIS — N644 Mastodynia: Secondary | ICD-10-CM

## 2017-06-06 ENCOUNTER — Encounter: Payer: Self-pay | Admitting: Genetics

## 2017-06-28 ENCOUNTER — Other Ambulatory Visit: Payer: Self-pay

## 2017-06-28 ENCOUNTER — Encounter: Payer: Self-pay | Admitting: Genetics

## 2017-07-06 ENCOUNTER — Ambulatory Visit (HOSPITAL_COMMUNITY)
Admission: EM | Admit: 2017-07-06 | Discharge: 2017-07-06 | Disposition: A | Discharge status: Home / Self Care | Payer: 59 | Attending: Family Medicine | Admitting: Family Medicine

## 2017-07-06 ENCOUNTER — Other Ambulatory Visit: Payer: Self-pay

## 2017-07-06 ENCOUNTER — Encounter (HOSPITAL_COMMUNITY): Payer: Self-pay | Admitting: Emergency Medicine

## 2017-07-06 DIAGNOSIS — L259 Unspecified contact dermatitis, unspecified cause: Secondary | ICD-10-CM

## 2017-07-06 MED ORDER — PREDNISONE 10 MG (21) PO TBPK: ORAL_TABLET | Freq: Every day | ORAL | 0 refills | Status: DC

## 2017-07-06 NOTE — ED Triage Notes (Signed)
Patient says she has intermittent "hive" and itching for 4-5 days ago.  So far only trunk of body involved, no arm or leg involvement

## 2017-07-06 NOTE — ED Provider Notes (Signed)
  [facility]   [id number] 07/06/17 Arrival Time: 1417  ASSESSMENT & PLAN:  1. Contact dermatitis, unspecified contact dermatitis type, unspecified trigger     Meds ordered this encounter  Medications  . predniSONE (STERAPRED UNI-PAK 21 TAB) 10 MG (21) TBPK tablet    Sig: Take daily by mouth. Take as directed.    Dispense:  21 tablet    Refill:  0   F/U if not improving over the next few days. Benadryl if needed.  Reviewed expectations re: course of current medical issues. Questions answered. Outlined signs and symptoms indicating need for more acute intervention. Patient verbalized understanding. After Visit Summary given.   SUBJECTIVE:  Angela NevinMonet Ciampa is a 26 y.o. female who presents with complaint of intermittent itching rash "like hives" for the past 4-5 days. Trunk of body. Benadryl with mild help. Afebrile. H/O similar with unknown trigger. No n/v/respiratory problems.  Rash:   ROS: As per HPI.  OBJECTIVE: Vitals:   07/06/17 1440  BP: (!) 110/58  Pulse: 87  Resp: 18  Temp: 98.4 F (36.9 C)  TempSrc: Oral  SpO2: 99%    General appearance: alert; no distress Lungs: clear to auscultation bilaterally Heart: regular rate and rhythm Extremities: no edema Skin: warm and dry; urticaria over trunk of body Psychological: alert and cooperative; normal mood and affect   Allergies  Allergen Reactions  . Clindamycin/Lincomycin Nausea And Vomiting  . Cyclobenzaprine Hives  . Penicillins Rash    Past Medical History:  Diagnosis Date  . Allergy   . Anxiety   . Depression   . Fibromyalgia   . Strep throat    Social History   Socioeconomic History  . Marital status: Single    Spouse name: Not on file  . Number of children: 0  . Years of education: Not on file  . Highest education level: Not on file  Social Needs  . Financial resource strain: Not on file  . Food insecurity - worry: Not on file  . Food insecurity - inability: Not on file  .  Transportation needs - medical: Not on file  . Transportation needs - non-medical: Not on file  Occupational History    Comment: college senior  Tobacco Use  . Smoking status: Current Some Day Smoker    Types: Pipe, Cigars  . Smokeless tobacco: Never Used  . Tobacco comment: 10/06/15 Black and Mild  Substance and Sexual Activity  . Alcohol use: Yes    Comment: occass  . Drug use: Yes    Types: Marijuana  . Sexual activity: Not on file  Other Topics Concern  . Not on file  Social History Narrative   Lives alone    Caffeine use- tea, 1-2 glasses /day   Family History  Problem Relation Age of Onset  . Diabetes Father   . Stroke Father   . Breast cancer Maternal Grandmother    Past Surgical History:  Procedure Laterality Date  . WISDOM TOOTH EXTRACTION       Mardella LaymanHagler, Paislyn Domenico, MD 07/11/17 1000

## 2017-08-22 ENCOUNTER — Ambulatory Visit (HOSPITAL_COMMUNITY)
Admission: EM | Admit: 2017-08-22 | Discharge: 2017-08-22 | Disposition: A | Discharge status: Home / Self Care | Payer: 59 | Attending: Family Medicine | Admitting: Family Medicine

## 2017-08-22 ENCOUNTER — Encounter (HOSPITAL_COMMUNITY): Payer: Self-pay | Admitting: Family Medicine

## 2017-08-22 DIAGNOSIS — M542 Cervicalgia: Secondary | ICD-10-CM

## 2017-08-22 DIAGNOSIS — M79602 Pain in left arm: Secondary | ICD-10-CM | POA: Diagnosis not present

## 2017-08-22 DIAGNOSIS — X500XXA Overexertion from strenuous movement or load, initial encounter: Secondary | ICD-10-CM

## 2017-08-22 MED ORDER — KETOROLAC TROMETHAMINE 60 MG/2ML IM SOLN
60.0000 mg | Freq: Once | INTRAMUSCULAR | Status: AC
  Administered 2017-08-22: 60 mg via INTRAMUSCULAR

## 2017-08-22 MED ORDER — KETOROLAC TROMETHAMINE 60 MG/2ML IM SOLN
INTRAMUSCULAR | Status: AC
  Filled 2017-08-22: qty 2

## 2017-08-22 NOTE — ED Triage Notes (Signed)
Pt here for left shoulder, back and arm pain since this am. Believes its a fibromyalgia flare. Took ibuprofen without relief.

## 2017-08-22 NOTE — ED Provider Notes (Signed)
[facility]    CSN: [id number] Arrival date & time: 08/22/17  1300     History   Chief Complaint Chief Complaint  Patient presents with  . Shoulder Pain  . Neck Pain  . Arm Pain    HPI Robin Richmond is a 26 y.o. female presenting with acute shoulder/neck pain for 1 day. States she has a history of fibromyalgia and believes this is a flare. Pain worsens with movement. Normally ibuprofen will control pain, but not today. Reports relief with injection at previous visits. Denies any injury. 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 Denies history of diabetes or heart disease in family. Occasional smoker.   HPI  Past Medical History:  Diagnosis Date  . Allergy   . Anxiety   . Depression   . Fibromyalgia   . Strep throat     There are no active problems to display for this patient.   Past Surgical History:  Procedure Laterality Date  . WISDOM TOOTH EXTRACTION      OB History    No data available       Home Medications    Prior to Admission medications   Medication Sig Start Date End Date Taking? Authorizing Provider  amphetamine-dextroamphetamine (ADDERALL) 5 MG tablet Take 5 mg daily by mouth.    [provider]  DOXYCYCLINE HYCLATE PO Take by mouth.    [provider]  fluticasone (FLONASE) 50 MCG/ACT nasal spray Place 1 spray into both nostrils 2 (two) times daily. 05/05/15   Linna HoffKindl, James D, MD  ondansetron (ZOFRAN) 4 MG tablet Take 1 tablet (4 mg total) by mouth every 6 (six) hours. 02/02/17   Law, Waylan BogaAlexandra M, PA-C  OVER THE COUNTER MEDICATION     [provider]  predniSONE (STERAPRED UNI-PAK 21 TAB) 10 MG (21) TBPK tablet Take daily by mouth. Take as directed. 07/06/17   Mardella LaymanHagler, Brian, MD  traMADol (ULTRAM) 50 MG tablet Take 1 tablet (50 mg total) by mouth every 6 (six) hours as needed. 05/12/17   Lurline IdolMurrill, Samantha, FNP    Family History Family History  Problem Relation Age of Onset  . Diabetes  Father   . Stroke Father   . Breast cancer Maternal Grandmother     Social History Social History   Tobacco Use  . Smoking status: Current Some Day Smoker    Types: Pipe, Cigars  . Smokeless tobacco: Never Used  . Tobacco comment: 10/06/15 Black and Mild  Substance Use Topics  . Alcohol use: Yes    Comment: occass  . Drug use: Yes    Types: Marijuana     Allergies   Clindamycin/lincomycin; Cyclobenzaprine; and Penicillins   Review of Systems Review of Systems  Constitutional: Negative for fatigue and fever.  Gastrointestinal: Negative for nausea and vomiting.  Musculoskeletal: Positive for myalgias and neck pain. Negative for back pain.  Neurological: Negative for dizziness, weakness, light-headedness, numbness and headaches.     Physical Exam Triage Vital Signs ED Triage Vitals [08/22/17 1356]  Enc Vitals Group     BP (!) 116/58     Pulse Rate 75     Resp 18     Temp 98.5 F (36.9 C)     Temp src      SpO2 100 %     Weight      Height      Head Circumference      Peak Flow      Pain Score  Pain Loc      Pain Edu?      Excl. in GC?    No data found.  Updated Vital Signs BP (!) 116/58   Pulse 75   Temp 98.5 F (36.9 C)   Resp 18   SpO2 100%    Physical Exam  Constitutional: She is oriented to person, place, and time. She appears well-developed and well-nourished. No distress.  Sitting in a manner avoiding moving left arm and neck  HENT:  Head: Normocephalic and atraumatic.  Eyes: Conjunctivae are normal.  Neck: Neck supple.  Cardiovascular: Normal rate and regular rhythm.  No murmur heard. Pulmonary/Chest: Effort normal and breath sounds normal. No respiratory distress.  Abdominal: Soft. There is no tenderness.  Musculoskeletal: She exhibits no edema.  Left neck/shoulder: moderate tenderness to palpation of neck musculature. Limited ROM due to pain with leftward rotation. Shoulder active ROM limited due to pain, more ROM with passive,  patient resists movement above 90 due to pain.  Neurological: She is alert and oriented to person, place, and time.  Skin: Skin is warm and dry.  Psychiatric: She has a normal mood and affect.  Nursing note and vitals reviewed.    UC Treatments / Results  Labs (all labs ordered are listed, but only abnormal results are displayed) Labs Reviewed - No data to display  EKG  EKG Interpretation None       Radiology No results found.  Procedures Procedures (including critical care time)  Medications Ordered in UC Medications  ketorolac (TORADOL) injection 60 mg (60 mg Intramuscular Given 08/22/17 1518)     Initial Impression / Assessment and Plan / UC Course  I have reviewed the triage vital signs and the nursing notes.  Pertinent labs & imaging results that were available during my care of the patient were reviewed by me and considered in my medical decision making (see chart for details).     Unrelated to acute injury, possible strain vs. Fibromyalgia flare. Toradol 60 mg given with some relief. Advised to continue Tylenol/Ibuprofen at home, avoiding muscle relaxer as she has an allergy to flexeril. Follow up with PCP for further management and vitamin D level check. Discussed return precautions. Patient verbalized understanding and is agreeable with plan.   Final Clinical Impressions(s) / UC Diagnoses   Final diagnoses:  Left arm pain    ED Discharge Orders    None       Controlled Substance Prescriptions [place] Controlled Substance Registry consulted? Not Applicable   Lew DawesWieters, Anhar Mcdermott C, [place] JerseyPA-C 08/22/17 2030

## 2017-08-22 NOTE — Discharge Instructions (Addendum)
We gave you an injection of Toradol today for your shoulder/neck pain. Please continue Tylenol/Ibuprofen for pain at home. May try heating pad.   Please follow up with a primary care doctor for Vitamin D levels.  You may look on the [facility] homepage or contact the clinic below.

## 2017-10-05 ENCOUNTER — Encounter (HOSPITAL_COMMUNITY): Payer: Self-pay | Admitting: Emergency Medicine

## 2017-10-05 ENCOUNTER — Emergency Department (HOSPITAL_COMMUNITY)
Admission: EM | Admit: 2017-10-05 | Discharge: 2017-10-06 | Disposition: A | Discharge status: Home / Self Care | Payer: 59 | Attending: Emergency Medicine | Admitting: Emergency Medicine

## 2017-10-05 DIAGNOSIS — Z79899 Other long term (current) drug therapy: Secondary | ICD-10-CM | POA: Insufficient documentation

## 2017-10-05 DIAGNOSIS — F17290 Nicotine dependence, other tobacco product, uncomplicated: Secondary | ICD-10-CM | POA: Diagnosis not present

## 2017-10-05 DIAGNOSIS — F330 Major depressive disorder, recurrent, mild: Secondary | ICD-10-CM | POA: Diagnosis not present

## 2017-10-05 DIAGNOSIS — F329 Major depressive disorder, single episode, unspecified: Secondary | ICD-10-CM | POA: Diagnosis present

## 2017-10-05 DIAGNOSIS — F331 Major depressive disorder, recurrent, moderate: Secondary | ICD-10-CM | POA: Diagnosis present

## 2017-10-05 LAB — CBC
HEMATOCRIT: 40 % (ref 36.0–46.0)
Hemoglobin: 12.8 g/dL (ref 12.0–15.0)
MCH: 28.3 pg (ref 26.0–34.0)
MCHC: 32 g/dL (ref 30.0–36.0)
MCV: 88.3 fL (ref 78.0–100.0)
PLATELETS: 206 10*3/uL (ref 150–400)
RBC: 4.53 MIL/uL (ref 3.87–5.11)
RDW: 14.3 % (ref 11.5–15.5)
WBC: 9.9 10*3/uL (ref 4.0–10.5)

## 2017-10-05 LAB — COMPREHENSIVE METABOLIC PANEL
ALK PHOS: 74 U/L (ref 38–126)
ALT: 25 U/L (ref 14–54)
ANION GAP: 9 (ref 5–15)
AST: 23 U/L (ref 15–41)
Albumin: 3.9 g/dL (ref 3.5–5.0)
BILIRUBIN TOTAL: 0.6 mg/dL (ref 0.3–1.2)
BUN: 13 mg/dL (ref 6–20)
CALCIUM: 8.8 mg/dL — AB (ref 8.9–10.3)
CO2: 23 mmol/L (ref 22–32)
Chloride: 109 mmol/L (ref 101–111)
Creatinine, Ser: 0.69 mg/dL (ref 0.44–1.00)
GFR calc Af Amer: >60 mL/min (ref >60)
GLUCOSE: 114 mg/dL — AB (ref 65–99)
POTASSIUM: 3.5 mmol/L (ref 3.5–5.1)
Sodium: 141 mmol/L (ref 135–145)
TOTAL PROTEIN: 7.6 g/dL (ref 6.5–8.1)

## 2017-10-05 LAB — ACETAMINOPHEN LEVEL

## 2017-10-05 LAB — SALICYLATE LEVEL: Salicylate Lvl: <7 mg/dL (ref 2.8–30.0)

## 2017-10-05 LAB — I-STAT BETA HCG BLOOD, ED (MC, WL, AP ONLY): I-stat hCG, quantitative: <5 m[IU]/mL (ref <5)

## 2017-10-05 LAB — ETHANOL

## 2017-10-05 MED ORDER — AMPHETAMINE-DEXTROAMPHETAMINE 10 MG PO TABS: 5.0000 mg | ORAL_TABLET | Freq: Every day | ORAL | Status: DC

## 2017-10-05 MED ORDER — ONDANSETRON HCL 4 MG PO TABS: 4.0000 mg | ORAL_TABLET | Freq: Four times a day (QID) | ORAL | Status: DC

## 2017-10-05 MED ORDER — FLUTICASONE PROPIONATE 50 MCG/ACT NA SUSP
1.0000 | Freq: Two times a day (BID) | NASAL | Status: DC
  Administered 2017-10-05 – 2017-10-06 (×2): 1 via NASAL
  Filled 2017-10-05: qty 16
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
## 2017-10-05 NOTE — ED Notes (Signed)
Bed: [facility]3 Expected date:  Expected time:  Means of arrival:  Comments: 

## 2017-10-05 NOTE — ED Notes (Signed)
Pt A&O x 3, no distress noted, calm & cooperative.  Visiting with family at present.  Monitoring for safety, Q 15 min checks in effect. 

## 2017-10-05 NOTE — ED Notes (Addendum)
Verbal permission to discuss information with mother from pt. Pt's mother at bedside.   Pt reports that she is not sure she wants to stay, she reports that she wants to talk with the psych about getting her medications changed, but does not necesarrilly want to stay here.  Pt reports that she is not sure that she wants to be admitted.  Pt encouraged to wait to see the psychiatrist in the AM to discuss medications/admission with them.

## 2017-10-05 NOTE — ED Notes (Signed)
Bed: [facility]37 Expected date:  Expected time:  Means of arrival:  Comments: Triage 3  

## 2017-10-05 NOTE — BHH Counselor (Addendum)
Spoke with pt and pt's mother and they stated they were under the impression they were coming to the ER to get a referral for a psychiatrist.  TTS informed the pt and her mother that the recommendation in inpatient treatment. And the pt can be reassessed in the morning.  The pt said that is fine.

## 2017-10-05 NOTE — ED Notes (Signed)
Pt ambulatory ti room 37 w/o difficulty, TTS into see.

## 2017-10-05 NOTE — ED Notes (Signed)
Patient wanded by security and changed into papers scrubs. One belonging bag in triage cabinet.

## 2017-10-05 NOTE — ED Notes (Signed)
Pt's mom into see 

## 2017-10-05 NOTE — ED Notes (Signed)
ED Provider at bedside. 

## 2017-10-05 NOTE — ED Triage Notes (Signed)
[facility] for [facility] depression and medications not helping and reports that she would try something if she got the opportunity and doesn't feel safe by herself.  PMH cutting.

## 2017-10-05 NOTE — ED Provider Notes (Signed)
[facility] Provider Note   CSN: [id number] Arrival date & time: 10/05/17  1612     History   Chief Complaint Chief Complaint  Patient presents with  . Suicidal    HPI Robin Richmond is a 27 y.o. female.  27 year old female with history of depression presents with suicidal ideations from her doctor's office.  Was seen here today because of worsening depression and had expressed some suicidal ideations.  She denies any intentional ingestions at this time.  No prior suicide attempt.  No auditory or visual hallucinations.  Does use marijuana but denies any use of daily alcohol.  Patient has been compliant with her antidepressants.  Nothing makes her symptoms better.      Past Medical History:  Diagnosis Date  . Allergy   . Anxiety   . Depression   . Fibromyalgia   . Strep throat     There are no active problems to display for this patient.   Past Surgical History:  Procedure Laterality Date  . WISDOM TOOTH EXTRACTION      OB History    No data available       Home Medications    Prior to Admission medications   Medication Sig Start Date End Date Taking? Authorizing Provider  amphetamine-dextroamphetamine (ADDERALL) 5 MG tablet Take 5 mg daily by mouth.    [provider]  DOXYCYCLINE HYCLATE PO Take by mouth.    [provider]  fluticasone (FLONASE) 50 MCG/ACT nasal spray Place 1 spray into both nostrils 2 (two) times daily. 05/05/15   Linna HoffKindl, James D, MD  ondansetron (ZOFRAN) 4 MG tablet Take 1 tablet (4 mg total) by mouth every 6 (six) hours. 02/02/17   Law, Waylan BogaAlexandra M, PA-C  OVER THE COUNTER MEDICATION     [provider]  predniSONE (STERAPRED UNI-PAK 21 TAB) 10 MG (21) TBPK tablet Take daily by mouth. Take as directed. 07/06/17   Mardella LaymanHagler, Brian, MD  traMADol (ULTRAM) 50 MG tablet Take 1 tablet (50 mg total) by mouth every 6 (six) hours as needed. 05/12/17   Lurline IdolMurrill, Samantha, FNP    Family  History Family History  Problem Relation Age of Onset  . Diabetes Father   . Stroke Father   . Breast cancer Maternal Grandmother     Social History Social History   Tobacco Use  . Smoking status: Current Some Day Smoker    Types: Pipe, Cigars  . Smokeless tobacco: Never Used  . Tobacco comment: 10/06/15 Black and Mild  Substance Use Topics  . Alcohol use: Yes    Comment: occass  . Drug use: Yes    Types: Marijuana     Allergies   Clindamycin/lincomycin; Cyclobenzaprine; and Penicillins   Review of Systems Review of Systems  All other systems reviewed and are negative.    Physical Exam Updated Vital Signs BP 124/69 (BP Location: Left Arm)   Pulse 68   Temp 98.2 F (36.8 C) (Oral)   Resp 17   SpO2 97%   Physical Exam  Constitutional: She is oriented to person, place, and time. She appears well-developed and well-nourished.  Non-toxic appearance. No distress.  HENT:  Head: Normocephalic and atraumatic.  Eyes: Conjunctivae, EOM and lids are normal. Pupils are equal, round, and reactive to light.  Neck: Normal range of motion. Neck supple. No tracheal deviation present. No thyroid mass present.  Cardiovascular: Normal rate, regular rhythm and normal heart sounds. Exam reveals no gallop.  No murmur heard. Pulmonary/Chest: Effort  normal and breath sounds normal. No stridor. No respiratory distress. She has no decreased breath sounds. She has no wheezes. She has no rhonchi. She has no rales.  Abdominal: Soft. Normal appearance and bowel sounds are normal. She exhibits no distension. There is no tenderness. There is no rebound and no CVA tenderness.  Musculoskeletal: Normal range of motion. She exhibits no edema or tenderness.  Neurological: She is alert and oriented to person, place, and time. She has normal strength. No cranial nerve deficit or sensory deficit. GCS eye subscore is 4. GCS verbal subscore is 5. GCS motor subscore is 6.  Skin: Skin is warm and dry. No  abrasion and no rash noted.  Psychiatric: Her affect is blunt. Her speech is delayed. She is slowed. She expresses suicidal ideation. She expresses no suicidal plans.  Nursing note and vitals reviewed.    ED Treatments / Results  Labs (all labs ordered are listed, but only abnormal results are displayed) Labs Reviewed  COMPREHENSIVE METABOLIC PANEL  ETHANOL  SALICYLATE LEVEL  ACETAMINOPHEN LEVEL  CBC  RAPID URINE DRUG SCREEN, HOSP PERFORMED  I-STAT BETA HCG BLOOD, ED ([facility], [facility], [facility] ONLY)    EKG  EKG Interpretation None       Radiology No results found.  Procedures Procedures (including critical care time)  Medications Ordered in ED Medications - No data to display   Initial Impression / Assessment and Plan / ED Course  I have reviewed the triage vital signs and the nursing notes.  Pertinent labs & imaging results that were available during my care of the patient were reviewed by me and considered in my medical decision making (see chart for details).    To be medically clear for psychiatric disposition  Final Clinical Impressions(s) / ED Diagnoses   Final diagnoses:  None    ED Discharge Orders    None       Lorre Nick, MD 10/05/17 1659

## 2017-10-06 DIAGNOSIS — F331 Major depressive disorder, recurrent, moderate: Secondary | ICD-10-CM | POA: Diagnosis present

## 2017-10-06 DIAGNOSIS — F330 Major depressive disorder, recurrent, mild: Secondary | ICD-10-CM | POA: Diagnosis not present

## 2017-10-06 DIAGNOSIS — Z79899 Other long term (current) drug therapy: Secondary | ICD-10-CM

## 2017-10-06 DIAGNOSIS — F17290 Nicotine dependence, other tobacco product, uncomplicated: Secondary | ICD-10-CM

## 2017-10-06 MED ORDER — ACETAMINOPHEN 325 MG PO TABS
650.0000 mg | ORAL_TABLET | Freq: Once | ORAL | Status: AC
  Administered 2017-10-06: 650 mg via ORAL
  Filled 2017-10-06: qty 2

## 2017-10-06 NOTE — Consult Note (Signed)
[facility] Consult   Reason for Consult:  Suicidal ideations Referring Physician:  EDP Patient Identification: Robin Richmond MRN:  [id number] Principal Diagnosis: Major depressive disorder, recurrent episode, mild ([place]) Diagnosis:   Patient Active Problem List   Diagnosis Date Noted  . Major depressive disorder, recurrent episode, mild ([place]) [F33.0] 10/06/2017    Priority: High    Total Time spent with patient: 45 minutes  Subjective:   Robin Richmond is a 27 y.o. female patient does not warrant admission.  HPI:  27 yo female who presented to the ED with suicidal ideations.  On assessment, she denies suicidal/homicidal ideations, hallucinations, and withdrawal symptoms. She reports a 3/10 depression, goes to counseling at her college. Her mother is coming from [place] to be with her and she has no safety concerns.  Coloring in her room pleasantly.  Stable for discharge.  Past Psychiatric History: depression  Risk to Self: None Risk to Others: Homicidal Ideation: No Thoughts of Harm to Others: No Current Homicidal Intent: No Current Homicidal Plan: No Access to Homicidal Means: No History of harm to others?: No Assessment of Violence: None Noted Does patient have access to weapons?: No Criminal Charges Pending?: No Does patient have a court date: No Prior Inpatient Therapy: Prior Inpatient Therapy: No Prior Outpatient Therapy: Prior Outpatient Therapy: No Does patient have an ACCT team?: No Does patient have Intensive In-House Services?  : No Does patient have [facility] services? : No Does patient have P4CC services?: No  Past Medical History:  Past Medical History:  Diagnosis Date  . Allergy   . Anxiety   . Depression   . Fibromyalgia   . Strep throat     Past Surgical History:  Procedure Laterality Date  . WISDOM TOOTH EXTRACTION     Family History:  Family History  Problem Relation Age of Onset  . Diabetes Father   . Stroke Father   . Breast  cancer Maternal Grandmother    Family Psychiatric  History: none Social History:  Social History   Substance and Sexual Activity  Alcohol Use Yes   Comment: occassional; BAC was clear     Social History   Substance and Sexual Activity  Drug Use Yes  . Types: Marijuana   Comment: Per report; UDS was clear    Social History   Socioeconomic History  . Marital status: Single    Spouse name: None  . Number of children: 0  . Years of education: None  . Highest education level: None  Social Needs  . Financial resource strain: None  . Food insecurity - worry: None  . Food insecurity - inability: None  . Transportation needs - medical: None  . Transportation needs - non-medical: None  Occupational History    Comment: college senior  Tobacco Use  . Smoking status: Current Some Day Smoker    Types: Pipe, Cigars  . Smokeless tobacco: Never Used  . Tobacco comment: 10/06/15 Black and Mild  Substance and Sexual Activity  . Alcohol use: Yes    Comment: occassional; BAC was clear  . Drug use: Yes    Types: Marijuana    Comment: Per report; UDS was clear  . Sexual activity: None  Other Topics Concern  . None  Social History Narrative   Lives alone    Caffeine use- tea, 1-2 glasses /day   Additional Social History: N/A    Allergies:   Allergies  Allergen Reactions  . Clindamycin/Lincomycin Nausea And Vomiting  . Cyclobenzaprine Hives  .  Penicillins Rash    Has patient had a PCN reaction causing immediate rash, facial/tongue/throat swelling, SOB or lightheadedness with hypotension: Yes Has patient had a PCN reaction causing severe rash involving mucus membranes or skin necrosis: No Has patient had a PCN reaction that required hospitalization: No Has patient had a PCN reaction occurring within the last 10 years: Yes If all of the above answers are "NO", then may proceed with Cephalosporin use.     Labs:  Results for orders placed or performed during the hospital  encounter of 10/05/17 (from the past 48 hour(s))  Comprehensive metabolic panel     Status: Abnormal   Collection Time: 10/05/17  4:54 PM  Result Value Ref Range   Sodium 141 135 - 145 mmol/L   Potassium 3.5 3.5 - 5.1 mmol/L   Chloride 109 101 - 111 mmol/L   CO2 23 22 - 32 mmol/L   Glucose, Bld 114 (H) 65 - 99 mg/dL   BUN 13 6 - 20 mg/dL   Creatinine, Ser 0.69 0.44 - 1.00 mg/dL   Calcium 8.8 (L) 8.9 - 10.3 mg/dL   Total Protein 7.6 6.5 - 8.1 g/dL   Albumin 3.9 3.5 - 5.0 g/dL   AST 23 15 - 41 U/L   ALT 25 14 - 54 U/L   Alkaline Phosphatase 74 38 - 126 U/L   Total Bilirubin 0.6 0.3 - 1.2 mg/dL   GFR calc non Af Amer >60 >60 mL/min   GFR calc Af Amer >60 >60 mL/min    Comment: (NOTE) The eGFR has been calculated using the CKD EPI equation. This calculation has not been validated in all clinical situations. eGFR's persistently <60 mL/min signify possible Chronic Kidney Disease.    Anion gap 9 5 - 15    Comment: Performed at [facility], [place] [address] [place], [place] [address]  Ethanol     Status: None   Collection Time: 10/05/17  4:54 PM  Result Value Ref Range   Alcohol, Ethyl (B) <10 <10 mg/dL    Comment:        LOWEST DETECTABLE LIMIT FOR SERUM ALCOHOL IS 10 mg/dL FOR MEDICAL PURPOSES ONLY Performed at [facility], [place] [address], [place], [place] [address]   Salicylate level     Status: None   Collection Time: 10/05/17  4:54 PM  Result Value Ref Range   Salicylate Lvl <2.3 2.8 - 30.0 mg/dL    Comment: Performed at [facility], [place] [address] [place], [place] [address]  Acetaminophen level     Status: Abnormal   Collection Time: 10/05/17  4:54 PM  Result Value Ref Range   Acetaminophen (Tylenol), Serum <10 (L) 10 - 30 ug/mL    Comment:        THERAPEUTIC CONCENTRATIONS VARY SIGNIFICANTLY. A RANGE OF 10-30 ug/mL MAY BE AN EFFECTIVE CONCENTRATION FOR MANY PATIENTS. HOWEVER, SOME ARE BEST TREATED AT  CONCENTRATIONS OUTSIDE THIS RANGE. ACETAMINOPHEN CONCENTRATIONS >150 ug/mL AT 4 HOURS AFTER INGESTION AND >50 ug/mL AT 12 HOURS AFTER INGESTION ARE OFTEN ASSOCIATED WITH TOXIC REACTIONS. Performed at [facility], [place] [address] [place], [place] [address]   cbc     Status: None   Collection Time: 10/05/17  4:54 PM  Result Value Ref Range   WBC 9.9 4.0 - 10.5 K/uL   RBC 4.53 3.87 - 5.11 MIL/uL   Hemoglobin 12.8 12.0 - 15.0 g/dL   HCT 40.0 36.0 - 46.0 %   MCV  88.3 78.0 - 100.0 fL   MCH 28.3 26.0 - 34.0 pg   MCHC 32.0 30.0 - 36.0 g/dL   RDW 14.3 11.5 - 15.5 %   Platelets 206 150 - 400 K/uL    Comment: Performed at [facility], [place] [address] [place], [place] [address]  I-Stat beta hCG blood, ED     Status: None   Collection Time: 10/05/17  5:06 PM  Result Value Ref Range   I-stat hCG, quantitative <5.0 <5 mIU/mL   Comment 3            Comment:   GEST. AGE      CONC.  (mIU/mL)   <=1 WEEK        5 - 50     2 WEEKS       50 - 500     3 WEEKS       100 - 10,000     4 WEEKS     1,000 - 30,000        FEMALE AND NON-PREGNANT FEMALE:     LESS THAN 5 mIU/mL     Current Facility-Administered Medications  Medication Dose Route Frequency Provider Last Rate Last Dose  . fluticasone (FLONASE) 50 MCG/ACT nasal spray 1 spray  1 spray Each Nare BID Lacretia Leigh, MD   1 spray at 10/05/17 2158  . ondansetron (ZOFRAN) tablet 4 mg  4 mg Oral Q6H Lacretia Leigh, MD       Current Outpatient Medications  Medication Sig Dispense Refill  . amphetamine-dextroamphetamine (ADDERALL XR) 10 MG 24 hr capsule Take 10 mg by mouth daily.    Marland Kitchen escitalopram (LEXAPRO) 10 MG tablet Take 10 mg by mouth daily.    Marland Kitchen etonogestrel (IMPLANON) 68 MG IMPL implant Inject 68 mg into the skin once.    . fluticasone (FLONASE) 50 MCG/ACT nasal spray Place 1 spray into both nostrils 2 (two) times daily. 16 g 2  . DOXYCYCLINE HYCLATE PO Take by mouth.    . predniSONE (STERAPRED  UNI-PAK 21 TAB) 10 MG (21) TBPK tablet Take daily by mouth. Take as directed. (Patient not taking: Reported on 10/05/2017) 21 tablet 0  . traMADol (ULTRAM) 50 MG tablet Take 1 tablet (50 mg total) by mouth every 6 (six) hours as needed. (Patient not taking: Reported on 10/05/2017) 15 tablet 0    Musculoskeletal: Strength & Muscle Tone: within normal limits Gait & Station: normal Patient leans: N/A  Psychiatric Specialty Exam: Physical Exam  Nursing note and vitals reviewed. Constitutional: She is oriented to person, place, and time. She appears well-developed and well-nourished.  HENT:  Head: Normocephalic.  Neck: Normal range of motion.  Respiratory: Effort normal.  Musculoskeletal: Normal range of motion.  Neurological: She is alert and oriented to person, place, and time.  Psychiatric: Her speech is normal and behavior is normal. Judgment and thought content normal. Cognition and memory are normal. She exhibits a depressed mood.    Review of Systems  Psychiatric/Behavioral: Positive for depression.  All other systems reviewed and are negative.   Blood pressure 113/69, pulse 63, temperature 98 F (36.7 C), temperature source Oral, resp. rate 14, SpO2 100 %.There is no height or weight on file to calculate BMI.  General Appearance: Casual  Eye Contact:  Good  Speech:  Normal Rate  Volume:  Normal  Mood:  Depressed, mild  Affect:  Congruent and Tearful  Thought Process:  Coherent and Descriptions of Associations: Intact  Orientation:  Full (Time, Place,  and Person)  Thought Content:  WDL and Logical  Suicidal Thoughts:  No  Homicidal Thoughts:  No  Memory:  Immediate;   Good Recent;   Good Remote;   Good  Judgement:  Fair  Insight:  Fair  Psychomotor Activity:  Normal  Concentration:  Concentration: Good and Attention Span: Good  Recall:  Good  Fund of Knowledge:  Good  Language:  Good  Akathisia:  No  Handed:  Right  AIMS (if indicated):   N/A  Assets:  Leisure  Time Physical Health Resilience Social Support  ADL's:  Intact  Cognition:  WNL  Sleep:   N/A     Treatment Plan Summary: Daily contact with patient to assess and evaluate symptoms and progress in treatment, Medication management and Plan major depressive disorder, recurrent, mild:  -Crisis stabilization -Medication management:  Continued her Lexapro 10 mg daily along with her medical medications -Individual counseling  Disposition: No evidence of imminent risk to self or others at present.   Patient does not meet criteria for psychiatric inpatient admission.  Waylan Boga, NP 10/06/2017 10:56 AM   Patient seen face-to-face for psychiatric evaluation, chart reviewed and case discussed with the physician extender and developed treatment plan. Reviewed the information documented and agree with the treatment plan.  Buford Dresser, DO 10/06/17 6:18 PM

## 2017-10-06 NOTE — ED Notes (Signed)
Pt discharged safely with mother and Aunt.  Pt was in no distress and contracts for safety.  All belongings were returned to patient and discharge instructions were reviewed.

## 2017-10-06 NOTE — [facility] Suicide Risk Assessment (Signed)
Suicide Risk Assessment  Discharge Assessment   [facility]   Principal Problem: Major depressive disorder, recurrent episode, mild (HCC) Discharge Diagnoses:  Patient Active Problem List   Diagnosis Date Noted  . Major depressive disorder, recurrent episode, mild (HCC) [F33.0] 10/06/2017    Priority: High    Total Time spent with patient: 45 minutes   Musculoskeletal: Strength & Muscle Tone: within normal limits Gait & Station: normal Patient leans: N/A  Psychiatric Specialty Exam: Physical Exam  Constitutional: She is oriented to person, place, and time. She appears well-developed and well-nourished.  HENT:  Head: Normocephalic.  Neck: Normal range of motion.  Respiratory: Effort normal.  Musculoskeletal: Normal range of motion.  Neurological: She is alert and oriented to person, place, and time.  Psychiatric: Her speech is normal and behavior is normal. Judgment and thought content normal. Cognition and memory are normal. She exhibits a depressed mood.    Review of Systems  Psychiatric/Behavioral: Positive for depression.  All other systems reviewed and are negative.   Blood pressure 113/69, pulse 63, temperature 98 F (36.7 C), temperature source Oral, resp. rate 14, SpO2 100 %.There is no height or weight on file to calculate BMI.  General Appearance: Casual  Eye Contact:  Good  Speech:  Normal Rate  Volume:  Normal  Mood:  Depressed, mild  Affect:  Congruent  Thought Process:  Coherent and Descriptions of Associations: Intact  Orientation:  Full (Time, Place, and Person)  Thought Content:  WDL and Logical  Suicidal Thoughts:  No  Homicidal Thoughts:  No  Memory:  Immediate;   Good Recent;   Good Remote;   Good  Judgement:  Fair  Insight:  Fair  Psychomotor Activity:  Normal  Concentration:  Concentration: Good and Attention Span: Good  Recall:  Good  Fund of Knowledge:  Good  Language:  Good  Akathisia:  No  Handed:  Right   AIMS (if indicated):     Assets:  Leisure Time Physical Health Resilience Social Support  ADL's:  Intact  Cognition:  WNL  Sleep:       Mental Status Per Nursing Assessment::   On Admission:   suicidal ideations  Demographic Factors:  Adolescent or young adult  Loss Factors: NA  Historical Factors: NA  Risk Reduction Factors:   Sense of responsibility to family, Living with another person, especially a relative, Positive social support and Positive therapeutic relationship  Continued Clinical Symptoms:  None  Cognitive Features That Contribute To Risk:  None    Suicide Risk:  Minimal: No identifiable suicidal ideation.  Patients presenting with no risk factors but with morbid ruminations; may be classified as minimal risk based on the severity of the depressive symptoms    Plan Of Care/Follow-up recommendations:  Activity:  as tolerated Diet:  heart healthy diet  Rosena Bartle, NP 10/06/2017, 4:11 PM

## 2017-10-06 NOTE — Discharge Instructions (Signed)
For your behavioral health needs you are advised to follow up at your earliest opportunity with the [facility] at [facility].  New patients are seen during walking hours, Monday - Friday from 8:00 am - 5:00 pm:       [facility]      Counseling Services      [address]      [phone]  

## 2017-10-06 NOTE — BH Assessment (Signed)
[facility]  Per Juanetta BeetsJacqueline Norman, DO, this pt does not require psychiatric hospitalization at this time.  [facility].  This has been included in pt's discharge instructions.  Pt's nurse, Kendal Hymendie, has been notified.  Doylene Canninghomas Culley Hedeen, MA Triage Specialist [phone]

## 2017-10-20 ENCOUNTER — Other Ambulatory Visit: Payer: Self-pay

## 2017-10-20 ENCOUNTER — Ambulatory Visit: Payer: 59 | Admitting: Family Medicine

## 2017-10-20 ENCOUNTER — Encounter: Payer: Self-pay | Admitting: Family Medicine

## 2017-10-20 VITALS — BP 130/76 | HR 106 | Temp 98.8°F | Ht 61.25 in | Wt 180.2 lb

## 2017-10-20 DIAGNOSIS — F4310 Post-traumatic stress disorder, unspecified: Secondary | ICD-10-CM | POA: Diagnosis not present

## 2017-10-20 DIAGNOSIS — M797 Fibromyalgia: Secondary | ICD-10-CM | POA: Diagnosis not present

## 2017-10-20 DIAGNOSIS — R41840 Attention and concentration deficit: Secondary | ICD-10-CM | POA: Insufficient documentation

## 2017-10-20 DIAGNOSIS — T7421XS Adult sexual abuse, confirmed, sequela: Secondary | ICD-10-CM

## 2017-10-20 DIAGNOSIS — T7421XA Adult sexual abuse, confirmed, initial encounter: Secondary | ICD-10-CM | POA: Insufficient documentation

## 2017-10-20 DIAGNOSIS — Z3040 Encounter for surveillance of contraceptives, unspecified: Secondary | ICD-10-CM | POA: Diagnosis not present

## 2017-10-20 DIAGNOSIS — F331 Major depressive disorder, recurrent, moderate: Secondary | ICD-10-CM

## 2017-10-20 DIAGNOSIS — E559 Vitamin D deficiency, unspecified: Secondary | ICD-10-CM

## 2017-10-20 HISTORY — DX: Post-traumatic stress disorder, unspecified: F43.10
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
## 2017-10-20 NOTE — Patient Instructions (Signed)
Please return in 1-3 months for your complete physical. Please come fasting. We will do your pap smear at that time as well.   Please take vitamin D 4000 units daily.   It was a pleasure meeting you today! Thank you for choosing us to meet your healthcare needs! I truly look forward to working with you. If you have any questions or concerns, please send me a message via Mychart or call the office at [phone].  When you're feeling too down to do anything, try these 10 Little things!  Take a shower. Even if you plan to stay in all day long and not see a soul, take a shower. It takes the most effort to hop in to the shower but once you do, you'll feel immediate results. It will wake you up and you'll be feeling much fresher (and cleaner too).  Brush and floss your teeth. Give your teeth a good brushing with a floss finish. It's a small task but it feels so good and you can check 'taking care of your health' off the list of things to do.  Do something small on your list. Most of us have some small thing we would like to get done (load of laundry, sew a button, email a friend). Doing one of these things will make you feel like you've accomplished something.  Drink water. Drinking water is easy right? It's also really beneficial for your health so keep a glass beside you all day and take sips often. It gives you energy and prevents you from boredom eating.  Do some floor exercises. The last thing you want to do is exercise but it might be just the thing you need the most. Keep it simple and do exercises that involve sitting or laying on the floor. Even the smallest of exercises release chemicals in the brain that make you feel good. Yoga stretches or core exercises are going to make you feel good with minimal effort.  Make your bed. Making your bed takes a few minutes but it's productive and you'll feel relieved when it's done. An unmade bed is a huge visual reminder that you're having an  unproductive day. Do it and consider it your housework for the day.  Put on some nice clothes. Take the sweatpants off even if you don't plan to go anywhere. Put on clothes that make you feel good. Take a look in the mirror so your brain recognizes the sweatpants have been replaced with clothes that make you look great. It's an instant confidence booster.  Wash the dishes. A pile of dirty dishes in the sink is a reflection of your mood. It's possible that if you wash up the dishes, your mood will follow suit. It's worth a try.  Cook a real meal. If you have the luxury to have a "do nothing" day, you have time to make a real meal for yourself. Make a meal that you love to eat. The process is good to get you out of the funk and the food will ensure you have more energy for tomorrow.  Write out your thoughts by hand. When you hand write, you stimulate your brain to focus on the moment that you're in so make yourself comfortable and write whatever comes into your mind. Put those thoughts out on paper so they stop spinning around in your head. Those thoughts might be the very thing holding you down.

## 2017-10-21 ENCOUNTER — Encounter: Payer: Self-pay | Admitting: Emergency Medicine

## 2017-11-30 ENCOUNTER — Other Ambulatory Visit (HOSPITAL_COMMUNITY)
Admission: RE | Admit: 2017-11-30 | Discharge: 2017-11-30 | Disposition: A | Discharge status: Home / Self Care | Payer: 59 | Source: Ambulatory Visit | Attending: Family Medicine | Admitting: Family Medicine

## 2017-11-30 ENCOUNTER — Other Ambulatory Visit: Payer: Self-pay | Admitting: Family Medicine

## 2017-11-30 DIAGNOSIS — Z01411 Encounter for gynecological examination (general) (routine) with abnormal findings: Secondary | ICD-10-CM | POA: Diagnosis not present

## 2017-12-01 LAB — CYTOLOGY - PAP
Diagnosis: NEGATIVE
HPV: DETECTED — AB

## 2018-01-03 IMAGING — US US SOFT TISSUE HEAD/NECK
1 series · 12 of 12 positions shown · non-contrast
Comparison: MRI cervical spine 09/27/2015

CLINICAL DATA: 25-year-old female with a history of palpable nodes.
Given history of bronchitis

EXAM:
ULTRASOUND OF HEAD/NECK SOFT TISSUES
TECHNIQUE: Ultrasound examination of the head and neck soft tissues was
performed in the area of clinical concern.

[Series 1: us soft tissue head/neck · 0.05mm/px · 12 of 12 slices shown]
[im 1/12]
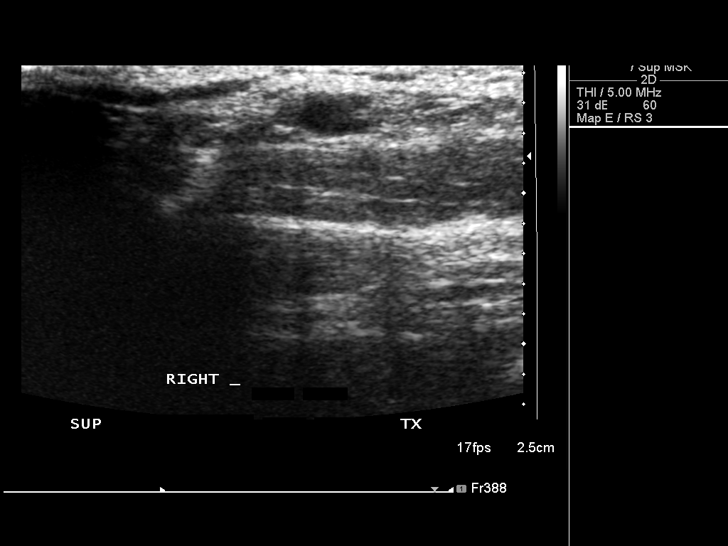
[im 2/12]
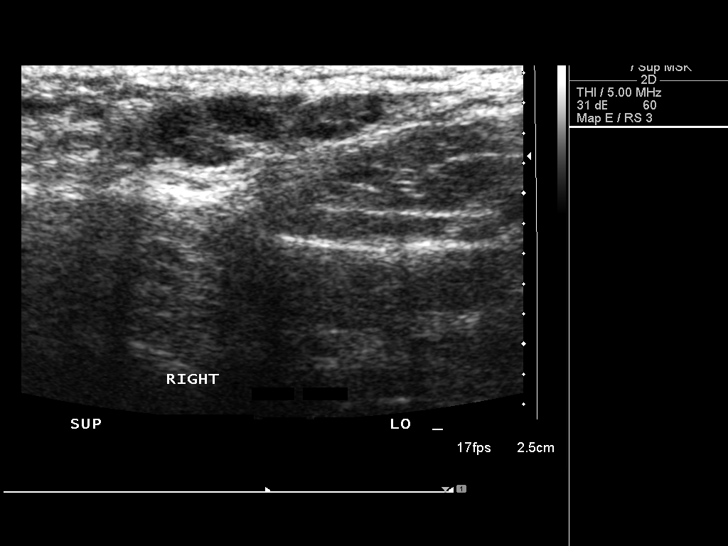
[im 3/12]
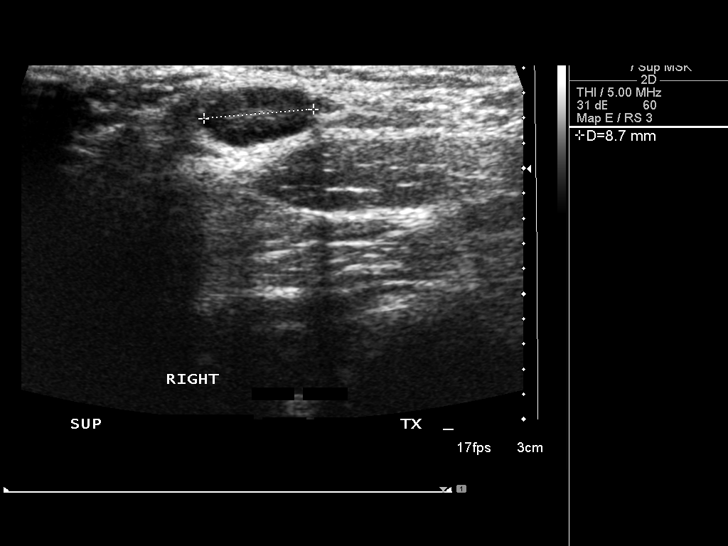
[im 4/12]
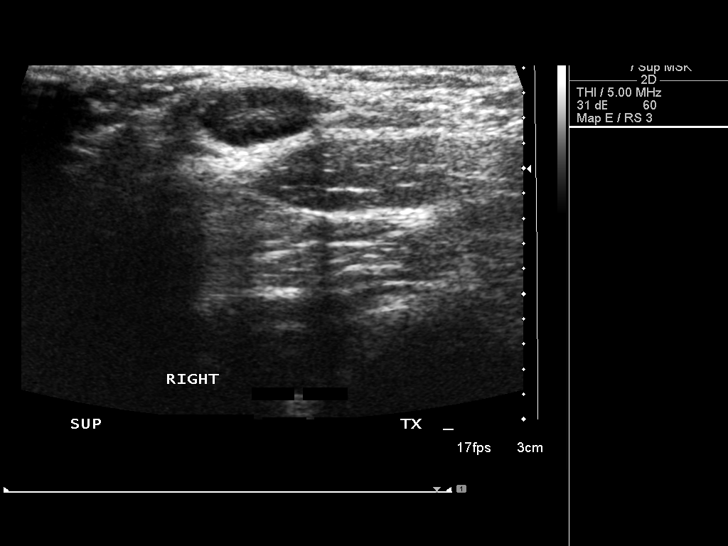
[im 5/12]
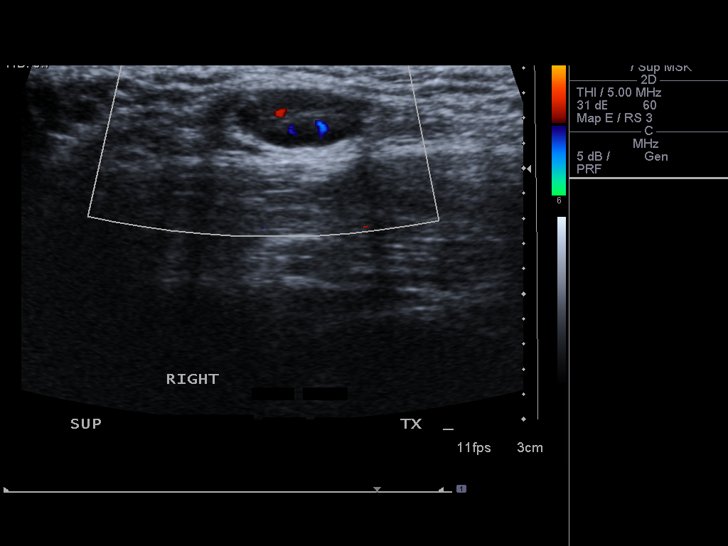
[im 6/12]
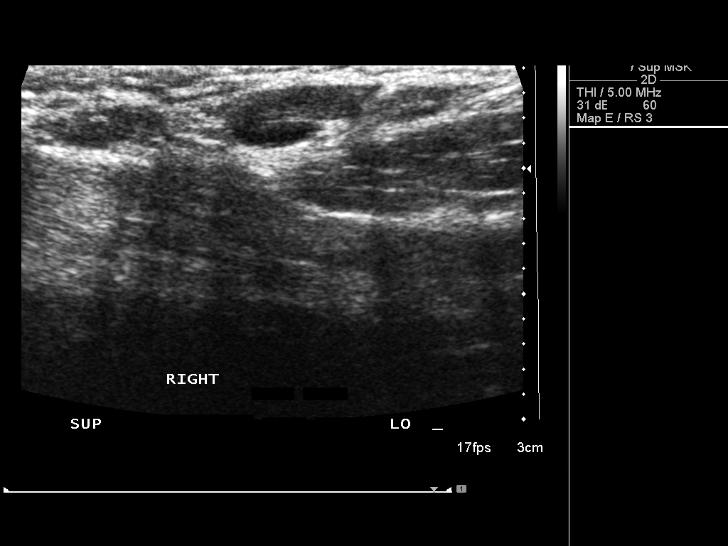
[im 7/12]
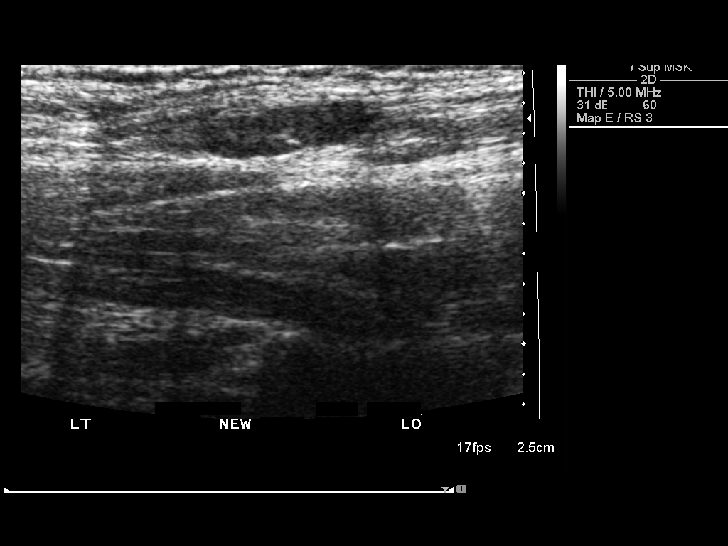
[im 8/12]
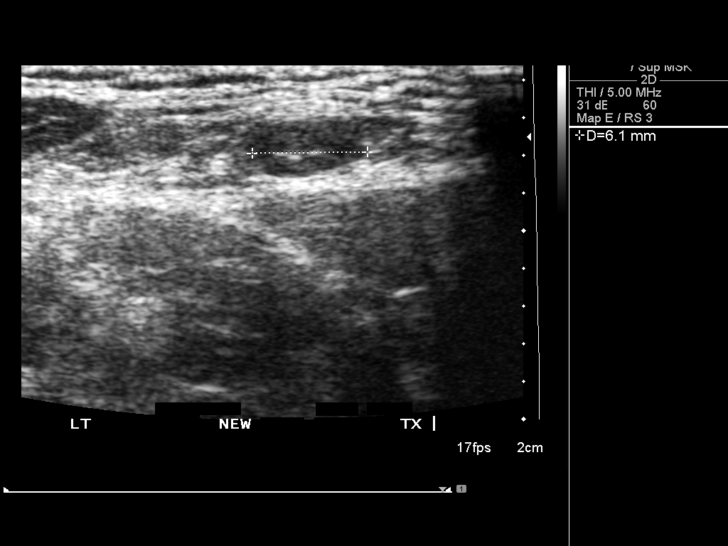
[im 9/12]
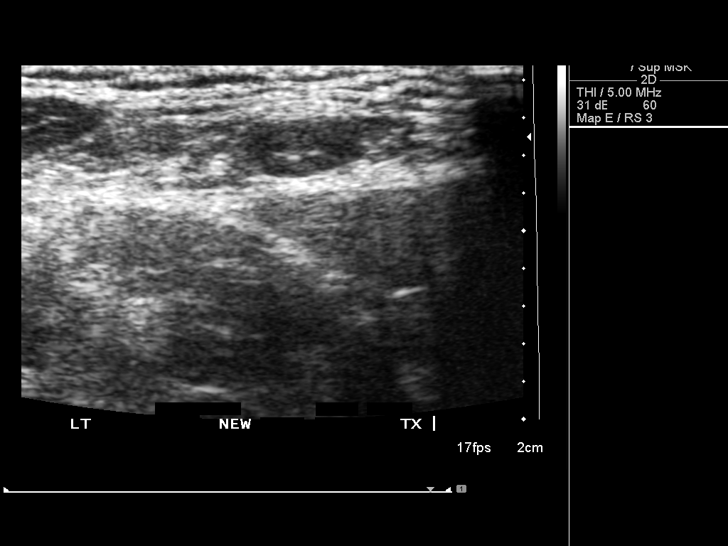
[im 10/12]
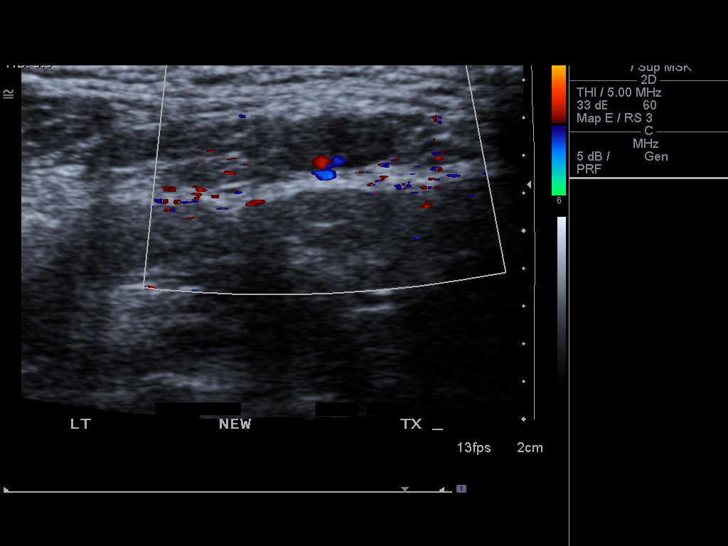
[im 11/12]
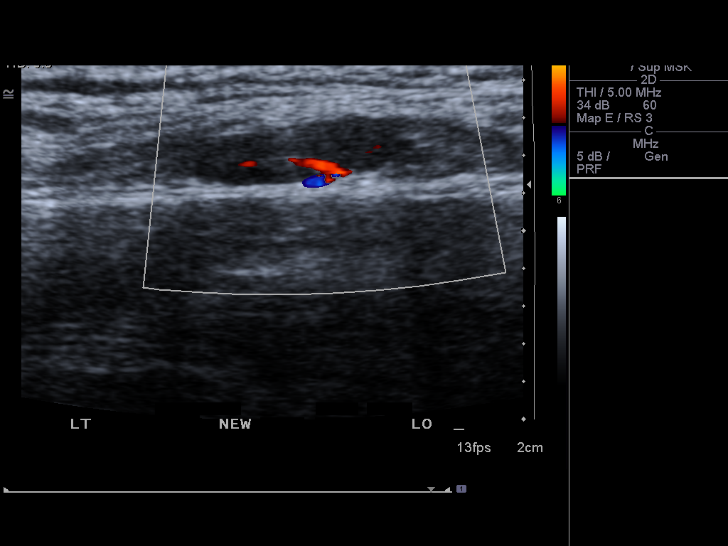
[im 12/12]
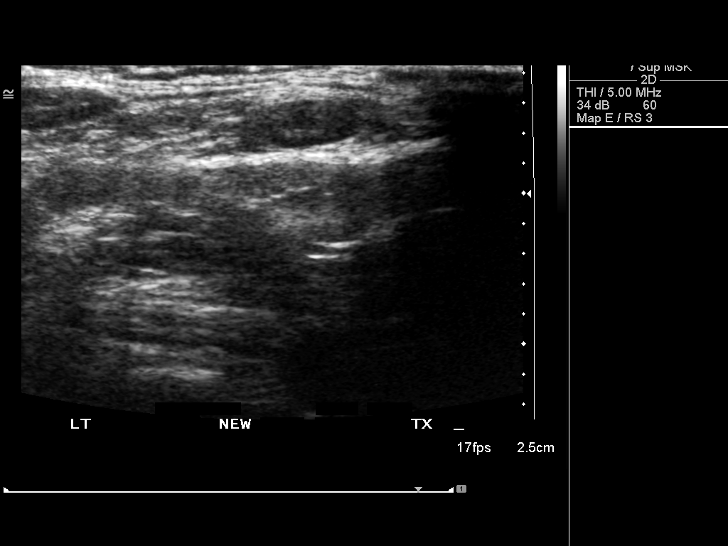

[12 of 12 positions shown; findings below may reference images not displayed]

FINDINGS: Grayscale and color duplex ultrasound performed of the neck.

No focal fluid collection.

No soft tissue lesion.

There are bilateral lymph nodes measured, all of which demonstrate
the usual features of hypoechoic cortex and hyperechoic hilum. None
of these are enlarged by head and neck criteria.
IMPRESSION: Sonographic survey demonstrates bilateral head neck lymph nodes,
most likely reactive given the history of recent bronchitis.
Correlation with lab values and patient presentation may be useful.

## 2018-03-01 ENCOUNTER — Encounter: Payer: Self-pay | Admitting: Neurology

## 2018-03-01 ENCOUNTER — Ambulatory Visit: Payer: 59 | Admitting: Neurology

## 2018-03-01 VITALS — BP 98/78 | HR 84 | Ht 60.0 in | Wt 172.0 lb

## 2018-03-01 DIAGNOSIS — R29898 Other symptoms and signs involving the musculoskeletal system: Secondary | ICD-10-CM

## 2018-03-01 DIAGNOSIS — R200 Anesthesia of skin: Secondary | ICD-10-CM | POA: Diagnosis not present

## 2018-03-01 DIAGNOSIS — G4452 New daily persistent headache (NDPH): Secondary | ICD-10-CM | POA: Diagnosis not present

## 2018-03-01 DIAGNOSIS — R519 Headache, unspecified: Secondary | ICD-10-CM

## 2018-03-01 DIAGNOSIS — R51 Headache: Secondary | ICD-10-CM | POA: Diagnosis not present
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
## 2018-03-01 NOTE — Patient Instructions (Addendum)
We will check MRI of brain and cervical spine with and without contrast.   Further recommendations pending results and whether follow up is needed.  [facility] and they will call you directly to schedule your appt. They are located at [address] If you need to contact them directly please call [phone].

## 2018-03-06 ENCOUNTER — Other Ambulatory Visit: Payer: Self-pay

## 2022-05-17 ENCOUNTER — Encounter: Payer: Self-pay | Admitting: *Deleted
